# Patient Record
Sex: Male | Born: 1955 | Race: Black or African American | Hispanic: No | Marital: Single | Smoking: Former smoker
Health system: Southern US, Community
[De-identification: ages and names within clinical notes are randomized; demographics above are authoritative.]

## PROBLEM LIST (undated history)

## (undated) DIAGNOSIS — I219 Acute myocardial infarction, unspecified: Secondary | ICD-10-CM

## (undated) DIAGNOSIS — I10 Essential (primary) hypertension: Secondary | ICD-10-CM

## (undated) DIAGNOSIS — I2510 Atherosclerotic heart disease of native coronary artery without angina pectoris: Secondary | ICD-10-CM

## (undated) DIAGNOSIS — I639 Cerebral infarction, unspecified: Secondary | ICD-10-CM

## (undated) DIAGNOSIS — E785 Hyperlipidemia, unspecified: Secondary | ICD-10-CM

## (undated) HISTORY — PX: COLONOSCOPY: SHX174

## (undated) HISTORY — DX: Atherosclerotic heart disease of native coronary artery without angina pectoris: I25.10

## (undated) HISTORY — DX: Hyperlipidemia, unspecified: E78.5

---

## 2008-04-21 DIAGNOSIS — I219 Acute myocardial infarction, unspecified: Secondary | ICD-10-CM

## 2008-04-21 HISTORY — DX: Acute myocardial infarction, unspecified: I21.9

## 2008-04-23 ENCOUNTER — Inpatient Hospital Stay (HOSPITAL_COMMUNITY): Admission: EM | Admit: 2008-04-23 | Discharge: 2008-04-27 | Payer: Self-pay | Admitting: Cardiology

## 2008-04-23 ENCOUNTER — Ambulatory Visit: Payer: Self-pay | Admitting: Internal Medicine

## 2008-04-24 ENCOUNTER — Encounter: Payer: Self-pay | Admitting: Cardiovascular Disease

## 2008-04-24 ENCOUNTER — Encounter: Payer: Self-pay | Admitting: Cardiology

## 2008-05-11 ENCOUNTER — Ambulatory Visit: Payer: Self-pay | Admitting: Cardiology

## 2009-01-31 DIAGNOSIS — I2510 Atherosclerotic heart disease of native coronary artery without angina pectoris: Secondary | ICD-10-CM

## 2009-01-31 DIAGNOSIS — E785 Hyperlipidemia, unspecified: Secondary | ICD-10-CM | POA: Insufficient documentation

## 2009-04-21 DIAGNOSIS — I639 Cerebral infarction, unspecified: Secondary | ICD-10-CM

## 2009-04-21 HISTORY — DX: Cerebral infarction, unspecified: I63.9

## 2009-11-03 ENCOUNTER — Observation Stay (HOSPITAL_COMMUNITY): Admission: EM | Admit: 2009-11-03 | Discharge: 2009-11-06 | Payer: Self-pay | Admitting: Emergency Medicine

## 2009-11-05 ENCOUNTER — Encounter (INDEPENDENT_AMBULATORY_CARE_PROVIDER_SITE_OTHER): Payer: Self-pay | Admitting: Neurology

## 2010-04-21 HISTORY — PX: COLONOSCOPY: SHX174

## 2010-07-06 LAB — GLUCOSE, CAPILLARY
Glucose-Capillary: 101 mg/dL — ABNORMAL HIGH (ref 70–99)
Glucose-Capillary: 107 mg/dL — ABNORMAL HIGH (ref 70–99)
Glucose-Capillary: 122 mg/dL — ABNORMAL HIGH (ref 70–99)
Glucose-Capillary: 85 mg/dL (ref 70–99)
Glucose-Capillary: 87 mg/dL (ref 70–99)
Glucose-Capillary: 91 mg/dL (ref 70–99)
Glucose-Capillary: 99 mg/dL (ref 70–99)

## 2010-07-06 LAB — CBC
HCT: 46.1 % (ref 39.0–52.0)
Hemoglobin: 15.9 g/dL (ref 13.0–17.0)
MCH: 32.6 pg (ref 26.0–34.0)
MCHC: 34.4 g/dL (ref 30.0–36.0)
MCV: 94.7 fL (ref 78.0–100.0)
Platelets: 186 10*3/uL (ref 150–400)
WBC: 4.1 10*3/uL (ref 4.0–10.5)

## 2010-07-06 LAB — LIPID PANEL
Cholesterol: 123 mg/dL (ref 0–200)
Total CHOL/HDL Ratio: 3.2 RATIO

## 2010-07-06 LAB — APTT: aPTT: 25 seconds (ref 24–37)

## 2010-07-06 LAB — DIFFERENTIAL
Eosinophils Absolute: 0.1 10*3/uL (ref 0.0–0.7)
Eosinophils Relative: 3 % (ref 0–5)
Monocytes Absolute: 0.4 10*3/uL (ref 0.1–1.0)
Neutro Abs: 1.8 10*3/uL (ref 1.7–7.7)

## 2010-07-06 LAB — URINALYSIS, ROUTINE W REFLEX MICROSCOPIC
Bilirubin Urine: NEGATIVE
Specific Gravity, Urine: 1.02 (ref 1.005–1.030)
pH: 5.5 (ref 5.0–8.0)

## 2010-07-06 LAB — PROTIME-INR: Prothrombin Time: 12.9 seconds (ref 11.6–15.2)

## 2010-07-06 LAB — DRUGS OF ABUSE SCREEN W/O ALC, ROUTINE URINE
Barbiturate Quant, Ur: NEGATIVE
Cocaine Metabolites: NEGATIVE
Creatinine,U: 181.9 mg/dL
Marijuana Metabolite: POSITIVE — AB
Methadone: NEGATIVE
Opiate Screen, Urine: NEGATIVE
Phencyclidine (PCP): NEGATIVE
Propoxyphene: NEGATIVE

## 2010-07-06 LAB — COMPREHENSIVE METABOLIC PANEL
AST: 28 U/L (ref 0–37)
Alkaline Phosphatase: 73 U/L (ref 39–117)
CO2: 28 mEq/L (ref 19–32)
Chloride: 102 mEq/L (ref 96–112)
GFR calc Af Amer: >60 mL/min (ref >60)
Glucose, Bld: 86 mg/dL (ref 70–99)
Potassium: 3.7 mEq/L (ref 3.5–5.1)
Sodium: 138 mEq/L (ref 135–145)

## 2010-07-06 LAB — HEMOGLOBIN A1C: Hgb A1c MFr Bld: 5.8 % — ABNORMAL HIGH (ref <5.7)

## 2010-07-06 LAB — THC (MARIJUANA), URINE, CONFIRMATION: Marijuana, Ur-Confirmation: 337 NG/ML — ABNORMAL HIGH

## 2010-07-06 LAB — CK TOTAL AND CKMB (NOT AT ARMC)
CK, MB: 4.1 ng/mL — ABNORMAL HIGH (ref 0.3–4.0)
Total CK: 138 U/L (ref 7–232)

## 2010-08-05 LAB — BASIC METABOLIC PANEL
BUN: 11 mg/dL (ref 6–23)
BUN: 17 mg/dL (ref 6–23)
Calcium: 9.1 mg/dL (ref 8.4–10.5)
GFR calc non Af Amer: >60 mL/min (ref >60)
GFR calc non Af Amer: >60 mL/min (ref >60)
Glucose, Bld: 106 mg/dL — ABNORMAL HIGH (ref 70–99)
Glucose, Bld: 91 mg/dL (ref 70–99)
Potassium: 4 mEq/L (ref 3.5–5.1)
Sodium: 135 mEq/L (ref 135–145)

## 2010-08-05 LAB — CBC
HCT: 44.7 % (ref 39.0–52.0)
HCT: 45.4 % (ref 39.0–52.0)
HCT: 46.6 % (ref 39.0–52.0)
HCT: 48.2 % (ref 39.0–52.0)
Hemoglobin: 15.3 g/dL (ref 13.0–17.0)
Hemoglobin: 15.4 g/dL (ref 13.0–17.0)
Hemoglobin: 16.4 g/dL (ref 13.0–17.0)
MCHC: 33.5 g/dL (ref 30.0–36.0)
MCHC: 33.9 g/dL (ref 30.0–36.0)
MCHC: 34.3 g/dL (ref 30.0–36.0)
MCV: 92.9 fL (ref 78.0–100.0)
MCV: 93 fL (ref 78.0–100.0)
MCV: 93.6 fL (ref 78.0–100.0)
Platelets: 190 10*3/uL (ref 150–400)
RBC: 5.49 MIL/uL (ref 4.22–5.81)
RDW: 15 % (ref 11.5–15.5)
RDW: 15.4 % (ref 11.5–15.5)
RDW: 15.7 % — ABNORMAL HIGH (ref 11.5–15.5)
RDW: 15.8 % — ABNORMAL HIGH (ref 11.5–15.5)
WBC: 7.3 10*3/uL (ref 4.0–10.5)
WBC: 8.6 10*3/uL (ref 4.0–10.5)

## 2010-08-05 LAB — URINE MICROSCOPIC-ADD ON

## 2010-08-05 LAB — PROTEIN ELECTROPH W RFLX QUANT IMMUNOGLOBULINS
Alpha-2-Globulin: 14.2 % — ABNORMAL HIGH (ref 7.1–11.8)
Beta Globulin: 4.4 % — ABNORMAL LOW (ref 4.7–7.2)
Gamma Globulin: 23.3 % — ABNORMAL HIGH (ref 11.1–18.8)
M-Spike, %: NOT DETECTED g/dL

## 2010-08-05 LAB — LIPID PANEL
HDL: 47 mg/dL (ref >39)
Total CHOL/HDL Ratio: 3.2 RATIO
VLDL: 10 mg/dL (ref 0–40)

## 2010-08-05 LAB — URINALYSIS, ROUTINE W REFLEX MICROSCOPIC
Hgb urine dipstick: NEGATIVE
Leukocytes, UA: NEGATIVE
Nitrite: NEGATIVE
Protein, ur: 30 mg/dL — AB
Urobilinogen, UA: 0.2 mg/dL (ref 0.0–1.0)

## 2010-08-05 LAB — CARDIAC PANEL(CRET KIN+CKTOT+MB+TROPI): Relative Index: 1.8 (ref 0.0–2.5)

## 2010-09-03 NOTE — Cardiovascular Report (Signed)
NAME:  Ethan Fields, Ethan Fields NO.:  [id number]   MEDICAL RECORD NO.:  [id number]          PATIENT TYPE:  INP   LOCATION:  2910                         FACILITY:  [facility]   PHYSICIAN:  Veverly Fells. Excell Seltzer, MD  DATE OF BIRTH:  12-Apr-1956   DATE OF PROCEDURE:  04/23/2008  DATE OF DISCHARGE:                            CARDIAC CATHETERIZATION   PROCEDURES:  Left heart catheterization, selective coronary angiography,  and left ventricular angiography.   INDICATIONS:  Ethan Fields is a 55 year old gentleman who presented to  [facility] with 2 days of chest pain.  His EKG was suggestive of  an evolving inferior wall MI with ST elevation and T-wave changes.  He  describes persistent chest pain for 48 hours.  At its worse, the pain  has been 7/10; at present he rates it at 5/10.  He has no past cardiac  history.  We elected to proceed with emergent cardiac catheterization in  the setting of his inferior injury current and ongoing chest pain.   Risks and indications of the procedure were reviewed with the patient.  Informed consent was obtained.  The right groin was prepped, draped, and  anesthetized with 1% lidocaine.  Using a modified Seldinger technique, a  6-French sheath was placed in the right femoral artery.  Standard 6-  French Judkins catheters were used for coronary angiography and left  ventriculography.  All catheter exchanges were performed over a  guidewire.  The patient tolerated procedure well.  There were no  immediate complications.   FINDINGS:  Aortic pressure 171/94 with a mean of 124, left ventricular  pressure 170/2.   Left mainstem:  The left mainstem shows very minor luminal  irregularities, it is widely patent and trifurcates into the LAD,  intermediate branch, and left circumflex.   LAD:  The LAD is a very large caliber vessel.  It courses down and wraps  around the LV apex.  Proximally, there is an irregular 30% focal  stenosis before the first  septal perforator.  The remaining portions of  the vessel have no significant angiographic stenosis.  There were 2  small diagonal branches from the mid and distal LAD, but no major  diagonals are present.   Ramus intermedius:  The intermediate is a large-caliber vessel.  It  branches into 3 vessels.  In the midportion. there is no significant  stenosis present.   Left circumflex.  The left circumflex is a moderate-to-large vessel that  supplies a small OM1 and a large OM2 branch.  The mid left circumflex  after the first OM has a 40-50% stenosis at an area of angulation in the  vessel.  The midcircumflex further down in the vessel has a 30% stenosis  just before the second OM.  The large second OM is widely patent with no  significant stenosis.  There are no high-grade stenoses in the left  circumflex.   Right coronary artery:  The right coronary artery is dominant.  The  vessel supplies an acute marginal branch as well as a PDA and a small  posterolateral branch, both the PDA and posterolateral branches  end  abruptly and raised a question of whether they have distal occlusions.  Both vessels are very small distally at less than 1 mm.  The major  portions of the epicardial right coronary artery are widely patent.  There is mild luminal irregularities throughout the proximal and mid  vessel.  The mid vessel has a 30% stenosis.  There are no areas of  hypodensity and also no areas that are suggestive of plaque rupture.  There is a question of very distal PDA collateralization from the distal  left coronary artery system, but it is difficult to identify this for  certain.   Left ventriculography shows severe hypokinesis of the basal and mid  inferior walls.  The other remaining portions of the LV contract  normally.  The LVEF is estimated at 45%.   ASSESSMENT:  1. Inferior myocardial infarction, probably secondary to distal right      posterior descending artery occlusion.  2.  Nonobstructive left main, left anterior descending, and left      circumflex stenosis.  3. Moderate left ventricular dysfunction consistent with inferior      myocardial infarction.   PLAN:  Ethan Fields should be treated medically.  The right coronary  artery and its major branches have no significant stenosis with the  exception of a possible distal PDA occlusion in a very small portion of  this vessel.  We will treat with aspirin and Plavix.  We will resume  heparin drip 6 hours after the sheath is out.  The patient is  hypertensive and we will treat his blood pressure initially with beta-  blockers.      Veverly Fells. Excell Seltzer, MD  Electronically Signed     MDC/MEDQ  D:  04/23/2008  T:  04/24/2008  Job:  [id number]

## 2010-09-03 NOTE — H&P (Signed)
NAME:  Ethan, Fields NO.:  [id number]   MEDICAL RECORD NO.:  [id number]          PATIENT TYPE:  INP   LOCATION:  2910                         FACILITY:  [facility]   PHYSICIAN:  Therisa Doyne, MD    DATE OF BIRTH:  1955/11/16   DATE OF ADMISSION:  04/23/2008  DATE OF DISCHARGE:                              HISTORY & PHYSICAL   PRIMARY CARDIOLOGIST:  Dr. Excell Seltzer of [place].   PRIMARY CARE PHYSICIAN:  Dr. Erasmo Downer.   CHIEF COMPLAINT:  Chest pain status post ST elevation myocardial  infarction.   HISTORY OF PRESENT ILLNESS:  A 55 year old [place] American male with no  prior cardiovascular history and no prior past medical history who  presented to [place], [place] at [facility] with complaints  of chest pain.  Patient reports a 2-day history of sharp, substernal  chest pressure which radiates to his left arm.  Pain was described as  indigestion and at its worse was a 7/10 on the pain scale.  He also  complains of associated headache and emesis which occurred 5 times  yesterday.  He denies any lower-extremity edema, PND, orthopnea and  dyspnea on exertion.   While at the outside hospital, the patient was noted to have inferior ST  segment elevation and code STEMI was called.  The patient was  transferred to [facility] where he was taken emergently to the  cardiac catheterization lab.  In the cardiac catheterization lab it  showed a 100% distal right PDA occlusion with faint left-to-right  collaterals; there was evidence of inferior hypokinesis with a left  ventricular ejection fraction of 40 to 45%.  There was nonobstructive  disease in the left main, left anterior descending artery and left  circumflex artery.  It was recommended that medical therapy be pursued  as the distal PDA was a very small vessel; in addition, it was unclear  whether or not there was a complete occlusion of the PDA.   PAST MEDICAL HISTORY:  No known past history.   The patient states he has  not been to a doctor for the past few years.   SOCIAL HISTORY:  [place]  long-term girlfriend.  He has a 25 pack-year history of smoking stating  he smoked a pack per day for 25 years.  He drinks alcohol on occasion,  stating that he drinks less than 1 drink per month.  He does use  marijuana but denies using cocaine.   FAMILY HISTORY:  There is no history of early coronary disease.   REVIEW OF SYSTEMS:  All systems reviewed and negative except as  mentioned above in history of present illness.   PRESENT MEDICATIONS:  None.   ALLERGIES:  No known drug allergies.   PHYSICAL EXAMINATION:  VITAL SIGNS:  Temperature 98, blood pressure  145/102 on  nitroglycerin drip at 20 mg, pulse 76, respirations 18,  oxygen saturation 98% on 2L.  GENERAL:  No acute distress.  HEENT:  Normocephalic, atraumatic.  PERRLA.  Extraocular movements are  intact.  Oropharynx is pink and  moist without any lesions.  No lymphadenopathy.  No jugular venous distention.  LUNGS:  Crackles bilaterally.  CARDIOVASCULAR:  Regular rate and rhythm.  No murmurs or gallops.  ABDOMEN:  Positive bowel sounds, soft, nontender and nondistended.  EXTREMITIES:  No clubbing, cyanosis, or edema.  The cath site in the  right groin appears to be clean, dry and intact with no evidence of  hematoma.  Dorsalis pedis pulses are 2+ bilaterally.  SKIN:  No rashes.  BACK:  No CVA tenderness.   EKG from the outside hospital shows sinus rhythm at 72 beats per minute,  ST segment elevation, 2 mm inferior leads, T-wave in V3 and V4.   Labs: 1.  Hemoglobin 19.9, platelets 225, BUN 8, creatinine 1, total  protein 9.3, albumin 4.2, troponin 8.9, CK 938, CK-MB 112.   ASSESSMENT AND PLAN:  A 55 year old African American male with no prior  cardiovascular disease, no prior medical history who presents with 2  days of chest discomfort who was found to have evidence of an  inferior  myocardial infarction secondary to a probable distal right posterior  descending artery occlusion. American male with no prior  cardiovascular disease, no prior medical history who presents with 2  days of chest discomfort who was found to have evidence of an  inferior  myocardial infarction secondary to a probable distal right posterior  descending artery occlusion.   1. We will admit him to the [facility] Cardiology service to Dr. Earmon Phoenix      care and monitor in the CTU.   1. Inferior myocardial infarction.  At this time, the patient's chest      pain has improved and his cardiac catheterization shows the RCA to      be patent.  The major branches have no significant stenosis except the distal PDA  occlusion.  Because of this medical treatment will be the best course of  management.  Therefore, we will treat the patient with aspirin 325 mg  daily, start him on Plavix 75mg  daily, start Coreg 6.25 mg b.i.d., start  lipitor 80mg  daily.   1. Congestive heart failure with left ventricular dysfunction with      inferior hypokinesis.  We are going to start the patient on      appropriate heart failure therapy with Coreg and ACE inhibitor.   1. Elevated hemoglobin at outside hospital with  evidence of high      protein and a normal albumin and high calcium: we will repeat a CBC      and check SPEP and UPEP.   1. Fluids, electrolytes and nutrition.  Normal saline at 75cc/hour.      Therisa Doyne, MD  Electronically Signed     SJT/MEDQ  D:  04/23/2008  T:  04/23/2008  Job:  [id number]

## 2010-09-03 NOTE — Discharge Summary (Signed)
NAME:  Ethan Fields, Ethan Fields NO.:  [id number]   MEDICAL RECORD NO.:  [id number]          PATIENT TYPE:  INP   LOCATION:  6529                         FACILITY:  [facility]   PHYSICIAN:  Ethan Fells. Excell Seltzer, MD  DATE OF BIRTH:  Oct 11, 1955   DATE OF ADMISSION:  04/23/2008  DATE OF DISCHARGE:  04/27/2008                               DISCHARGE SUMMARY   PRIMARY CARDIOLOGIST:  Ethan Sidle, MD   INTERVENTIONAL CARDIOLOGIST:  Ethan Fells. Excell Seltzer, MD   PRIMARY CARE PHYSICIAN:  Ethan Downer, MD.   PROCEDURES PERFORMED DURING HOSPITALIZATION:  1. Emergent cardiac catheterization dated April 23, 2008 per Dr.      Tonny Fields revealing inferior myocardial infarction, probably      secondary to distal right posterior descending artery occlusion,      nonobstructive left main, left anterior descending, and left      circumflex stenosis, moderate left ventricular dysfunction      consistent with inferior myocardial infarction.  2. Echocardiogram dated April 24, 2008 despite measurements that      septum looks thick.  There is mild left ventricular cavity      obliteration in systole.  There is no systolic anterior motion of      the mitral valve.  There may be some decreased in thickening at the      base of the inferior wall.  The left ventricle was small.  Overall,      ventricular systolic function was vigorous, left ventricular      ejection fraction estimated at 70%.  Left ventricular wall      thickness was mildly increased, right ventricular size was at the      upper limits of normal, right ventricular systolic function was      mildly reduced.  There was mild right ventricular hypertrophy.  The      right atrium was mildly dilated.   FINAL DISCHARGE DIAGNOSES:  1. Inferior myocardial infarction, late presentation.  2. Post myocardial infarction pericarditis.  3. Hypertension.  4. Dyslipidemia.   HOSPITAL COURSE:  This is a 52-year African American male with no  prior  cardiac history and no prior medical history at all who presented to  [facility] with complaints of chest pain after a 2-day history of  sharp substernal pressure would radiating to his left arm.  He described  it as indigestion and that it worse, 7/10 on the pain scale.  While at  [facility], the patient was noted to have an inferior ST-segment  elevation and a code STEMI was called and the patient was transferred  emergently to [facility] for cardiac catheterization.  The  patient did undergo emergent cardiac catheterization per Dr. Tonny Fields revealing a 100% distal right PDA occlusion with faint left-to-  right collaterals.  There was also evidence of inferior hypokinesis with  left ventricular ejection fraction of 40-45%.  There was nonobstructive  disease elsewhere.   The patient was transferred to ICU and will be treated medically as the  right coronary artery and its major branches had no significant stenosis  with the exception of a possible distal PDA occlusion at a very small  portion of this vessel.  He was started on aspirin and Plavix, beta-  blocker, and nitroglycerin was weaned off.   The patient continued to have subscapular pleuritic pain postprocedure.  EKG showed diffuse ST elevation with symptoms suspicious for post MI  pericarditis.  At that point, his heparin was discontinued and the  patient was started on colchicine.  Echo was performed showing no  pericardial effusion.   The patient was continued on NSAIDs to include colchicine and higher  doses of aspirin at 325 b.i.d.  The patient was also started on Coreg  6.25 mg b.i.d., lisinopril 10 mg daily, Crestor daily.  On April 25, 2008, the patient was transferred to telemetry and worked with cardiac  rehab.   The patient also was given smoking cessation instructions and advised to  follow up with cardiac rehab and encouraged to continue smoking  cessation as he had during  hospitalization.   On day of discharge, the patient continued to have persistent chest  discomfort that was improving.  The pain remained pleuritic, but there  was no dyspnea or other complaints.  The patient's vital signs were  stable and the patient was felt healthy enough to return home.  The  patient will follow up in [place] and will be established with Dr. Nona Fields in our practice.  The patient will be seen in 2 weeks and will  follow with cardiac rehab in [place].   LABORATORIES:  Hemoglobin 15.3, hematocrit 44.7, white blood cells 6.9,  platelets 220.  Troponin initially 13.60, followup troponin 4.19 on  April 25, 2008, sodium 131, potassium 4.0, chloride 97, CO2 26, glucose  91, BUN 17, creatinine 1.02.  Chest x-ray dated April 24, 2008, no  active lung disease.   VITAL SIGNS ON DISCHARGE:  Blood pressure 105/63, pulse 89, respirations  18, temperature 97.9, O2 sat 96% on room air.  The patient weights 73.5  kg on discharge.   DISCHARGE MEDICATIONS:  1. Aspirin 325 mg twice a day for 2 weeks, then 325 mg daily.  2. Colchicine 0.1 mg twice a day for 2 weeks.  3. Coreg 6.25 mg twice a day.  4. Plavix 75 mg daily.  5. Lipitor 40 mg daily.  6. Lisinopril 10 mg daily.  7. Nitroglycerin 0.4 mg sublingual p.r.n. recurrent chest pain      (prescription provided for all medications as listed).   ALLERGIES:  No known drug allergies.   FOLLOWUP PLANS AND APPOINTMENT:  1. The patient will follow up with Dr. Nona Fields on Thursday,      May 11, 2008 at 10:30 a.m.  2. The patient is to follow up with primary care physician, Dr. Linna Fields      in [place].  3. The patient has been given smoking cessation instructions and      encouraged to quit.  4. The patient has been given post cardiac catheterization      instructions with particular episodes on the right groin for      evidence of bleeding hematoma or infection.  5. The patient has been advised to follow up with  cardiac rehab.  6. The patient has been advised on new medications prescribed.   TIME SPENT WITH THE PATIENT TO INCLUDE PHYSICIAN TIME:  35 minutes.      Ethan Mare. Lyman Bishop, NP      Ethan Fells. Excell Seltzer, MD  Electronically Signed  KML/MEDQ  D:  04/27/2008  T:  04/27/2008  Job:  [id number]   cc:   Ethan Downer, MD

## 2010-09-03 NOTE — Assessment & Plan Note (Signed)
[facility]                          [facility] OFFICE NOTE   NAME:Ethan Fields, Ethan Fields                       MRN:          [id number]  DATE:05/11/2008                            DOB:          12/07/1955    PRIMARY CARE PHYSICIAN:  Erasmo Downer, MD   REASON FOR VISIT:  Post hospital followup.   HISTORY OF PRESENT ILLNESS:  Mr. Cockerell was transferred urgently from  [facility] to [facility] back on April 23, 2008, with chest pain and concerns for an acute coronary syndrome  involving the inferior wall.  He underwent an urgent cardiac  catheterization by Dr. Excell Seltzer which revealed apparent occlusion and a  small posterior descending branch of the right coronary artery with  otherwise nonobstructive disease of the left system, no greater than 40-  50% predominantly involving the obtuse marginals, less significant left  anterior descending.  His ejection fraction was 45% with basal and mid  inferior hypokinesis.  He was treated medically with dual antiplatelet  regiment and also placed on Lipitor, carvedilol, and lisinopril.  He  comes to the office today reporting that his groin site has healed well  postcatheterization.  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  He states that this does not require any  heavy lifting, certainly nothing over 50 pounds.  He is not reporting  any anginal chest pain.  I reviewed his medications today.   ALLERGIES:  No known drug allergies.   MEDICATIONS:  1. Aspirin 325 mg p.o. daily.  2. Plavix 75 mg p.o. q.a.m.  3. Lipitor 40 mg p.o. nightly.  4. Carvedilol 6.25 mg p.o. b.i.d.  5. Lisinopril 10 mg p.o. q.a.m.  6. Sublingual nitroglycerin 0.4 mg p.r.n.   REVIEW OF SYSTEMS:  As described in the history of present illness,  otherwise negative.   PHYSICAL EXAMINATION:  VITAL SIGNS:  Blood pressure today is 160/90,  weight 158 pounds, heart rate 64 and  regular.  GENERAL:  This is a normally nourished-appearing male in no acute  distress.  HEENT:  Conjunctiva is normal.  Oropharynx is clear.  NECK:  Supple.  No elevated jugular venous pressure.  No loud bruits.  No thyromegaly.  LUNGS:  Clear without labored breathing.  CARDIAC:  Regular rate and rhythm without rub, murmur, or gallop.  No  pericardial rub.  ABDOMEN:  Soft, nontender.  Normoactive bowel sounds.  EXTREMITIES:  No significant pitting edema.  Distal pulses are 2+.  SKIN:  Warm and dry.  MUSCULOSKELETAL:  No kyphosis noted.  NEUROPSYCHIATRIC:  Alert and oriented x3.   IMPRESSION AND RECOMMENDATIONS:  Coronary artery disease status post  inferior wall myocardial infarction related to occlusion of the distal  posterior descending branch of the right coronary artery with otherwise  nonobstructive atherosclerosis within the left system and an ejection  fraction of 45%.  Plan at this point is for medical therapy which looks  reasonable, although may need further titration depending on blood  pressure and symptom control.  He will need followup lipid profile over  the next 3 months with goal LDL around 70.  He should be able to return  to work with prior stated responsibilities over the next week placing  him approximately 4 weeks out from his event.  I will plan to see him  back over the next 3 months.     Jonelle Sidle, MD  Electronically Signed    SGM/MedQ  DD: 05/11/2008  DT: 05/11/2008  Job #: [id number]   cc:   Erasmo Downer, MD

## 2011-11-13 ENCOUNTER — Encounter: Payer: Self-pay | Admitting: *Deleted

## 2016-10-28 ENCOUNTER — Observation Stay (HOSPITAL_COMMUNITY)
Admission: EM | Admit: 2016-10-28 | Discharge: 2016-10-29 | Disposition: A | Discharge status: Home / Self Care | Payer: BLUE CROSS/BLUE SHIELD | Attending: Internal Medicine | Admitting: Internal Medicine

## 2016-10-28 ENCOUNTER — Emergency Department (HOSPITAL_COMMUNITY): Payer: BLUE CROSS/BLUE SHIELD

## 2016-10-28 ENCOUNTER — Encounter (HOSPITAL_COMMUNITY): Payer: Self-pay | Admitting: Emergency Medicine

## 2016-10-28 DIAGNOSIS — I252 Old myocardial infarction: Secondary | ICD-10-CM | POA: Diagnosis not present

## 2016-10-28 DIAGNOSIS — Z87891 Personal history of nicotine dependence: Secondary | ICD-10-CM | POA: Insufficient documentation

## 2016-10-28 DIAGNOSIS — Z8673 Personal history of transient ischemic attack (TIA), and cerebral infarction without residual deficits: Secondary | ICD-10-CM | POA: Insufficient documentation

## 2016-10-28 DIAGNOSIS — R29701 NIHSS score 1: Secondary | ICD-10-CM | POA: Diagnosis not present

## 2016-10-28 DIAGNOSIS — I10 Essential (primary) hypertension: Secondary | ICD-10-CM | POA: Diagnosis not present

## 2016-10-28 DIAGNOSIS — I2510 Atherosclerotic heart disease of native coronary artery without angina pectoris: Secondary | ICD-10-CM | POA: Diagnosis present

## 2016-10-28 DIAGNOSIS — I639 Cerebral infarction, unspecified: Secondary | ICD-10-CM | POA: Diagnosis not present

## 2016-10-28 DIAGNOSIS — E785 Hyperlipidemia, unspecified: Secondary | ICD-10-CM | POA: Diagnosis present

## 2016-10-28 DIAGNOSIS — Z79899 Other long term (current) drug therapy: Secondary | ICD-10-CM | POA: Diagnosis not present

## 2016-10-28 DIAGNOSIS — E782 Mixed hyperlipidemia: Secondary | ICD-10-CM | POA: Diagnosis not present

## 2016-10-28 DIAGNOSIS — Z7902 Long term (current) use of antithrombotics/antiplatelets: Secondary | ICD-10-CM | POA: Insufficient documentation

## 2016-10-28 DIAGNOSIS — R531 Weakness: Secondary | ICD-10-CM | POA: Diagnosis present

## 2016-10-28 HISTORY — DX: Essential (primary) hypertension: I10

## 2016-10-28 HISTORY — DX: Acute myocardial infarction, unspecified: I21.9

## 2016-10-28 HISTORY — DX: Cerebral infarction, unspecified: I63.9

## 2016-10-28 LAB — CBC WITH DIFFERENTIAL/PLATELET
BASOS PCT: 0 %
Basophils Absolute: 0 10*3/uL (ref 0.0–0.1)
Eosinophils Absolute: 0 10*3/uL (ref 0.0–0.7)
Eosinophils Relative: 0 %
HEMATOCRIT: 43.8 % (ref 39.0–52.0)
Hemoglobin: 15.6 g/dL (ref 13.0–17.0)
Lymphocytes Relative: 27 %
Lymphs Abs: 2.1 10*3/uL (ref 0.7–4.0)
MCH: 32 pg (ref 26.0–34.0)
MCHC: 35.6 g/dL (ref 30.0–36.0)
MCV: 89.8 fL (ref 78.0–100.0)
MONO ABS: 0.7 10*3/uL (ref 0.1–1.0)
MONOS PCT: 9 %
NEUTROS ABS: 5 10*3/uL (ref 1.7–7.7)
Neutrophils Relative %: 64 %
Platelets: 225 10*3/uL (ref 150–400)
RBC: 4.88 MIL/uL (ref 4.22–5.81)
RDW: 16.6 % — AB (ref 11.5–15.5)
WBC: 7.7 10*3/uL (ref 4.0–10.5)

## 2016-10-28 LAB — COMPREHENSIVE METABOLIC PANEL
ALT: 20 U/L (ref 17–63)
AST: 22 U/L (ref 15–41)
Albumin: 3.9 g/dL (ref 3.5–5.0)
Alkaline Phosphatase: 78 U/L (ref 38–126)
Anion gap: 12 (ref 5–15)
BILIRUBIN TOTAL: 0.7 mg/dL (ref 0.3–1.2)
BUN: 12 mg/dL (ref 6–20)
CHLORIDE: 102 mmol/L (ref 101–111)
CO2: 25 mmol/L (ref 22–32)
Calcium: 9.2 mg/dL (ref 8.9–10.3)
Creatinine, Ser: 0.86 mg/dL (ref 0.61–1.24)
Glucose, Bld: 95 mg/dL (ref 65–99)
POTASSIUM: 4.1 mmol/L (ref 3.5–5.1)
Sodium: 139 mmol/L (ref 135–145)
TOTAL PROTEIN: 7.8 g/dL (ref 6.5–8.1)

## 2016-10-28 LAB — I-STAT CHEM 8, ED
BUN: 16 mg/dL (ref 6–20)
CALCIUM ION: 1.06 mmol/L — AB (ref 1.15–1.40)
Chloride: 106 mmol/L (ref 101–111)
Creatinine, Ser: 0.9 mg/dL (ref 0.61–1.24)
GLUCOSE: 97 mg/dL (ref 65–99)
HCT: 48 % (ref 39.0–52.0)
HEMOGLOBIN: 16.3 g/dL (ref 13.0–17.0)
Potassium: 4.3 mmol/L (ref 3.5–5.1)
Sodium: 140 mmol/L (ref 135–145)
TCO2: 24 mmol/L (ref 0–100)

## 2016-10-28 LAB — RAPID URINE DRUG SCREEN, HOSP PERFORMED
AMPHETAMINES: NOT DETECTED
BARBITURATES: NOT DETECTED
BENZODIAZEPINES: NOT DETECTED
Cocaine: NOT DETECTED
Opiates: NOT DETECTED
TETRAHYDROCANNABINOL: POSITIVE — AB

## 2016-10-28 LAB — ETHANOL: Alcohol, Ethyl (B): <5 mg/dL (ref <5)

## 2016-10-28 LAB — URINALYSIS, ROUTINE W REFLEX MICROSCOPIC
BILIRUBIN URINE: NEGATIVE
Glucose, UA: NEGATIVE mg/dL
HGB URINE DIPSTICK: NEGATIVE
KETONES UR: NEGATIVE mg/dL
Leukocytes, UA: NEGATIVE
Nitrite: NEGATIVE
PROTEIN: NEGATIVE mg/dL
Specific Gravity, Urine: 1.019 (ref 1.005–1.030)
pH: 5 (ref 5.0–8.0)

## 2016-10-28 LAB — I-STAT TROPONIN, ED: TROPONIN I, POC: 0.02 ng/mL (ref 0.00–0.08)

## 2016-10-28 LAB — PROTIME-INR
INR: 0.97
PROTHROMBIN TIME: 12.9 s (ref 11.4–15.2)

## 2016-10-28 LAB — APTT: aPTT: 27 seconds (ref 24–36)

## 2016-10-28 LAB — TROPONIN I

## 2016-10-28 MED ORDER — SODIUM CHLORIDE 0.9 % IV SOLN
100.0000 mL/h | INTRAVENOUS | Status: DC
  Administered 2016-10-28: 100 mL/h via INTRAVENOUS

## 2016-10-28 MED ORDER — SODIUM CHLORIDE 0.9 % IV BOLUS (SEPSIS)
500.0000 mL | Freq: Once | INTRAVENOUS | Status: AC
  Administered 2016-10-28: 500 mL via INTRAVENOUS

## 2016-10-28 MED ORDER — ASPIRIN 325 MG PO TABS
325.0000 mg | ORAL_TABLET | Freq: Every day | ORAL | Status: DC
  Administered 2016-10-28 – 2016-10-29 (×2): 325 mg via ORAL
  Filled 2016-10-28 (×2): qty 1

## 2016-10-28 MED ORDER — CLOPIDOGREL BISULFATE 75 MG PO TABS
75.0000 mg | ORAL_TABLET | Freq: Every day | ORAL | Status: DC
  Administered 2016-10-28 – 2016-10-29 (×2): 75 mg via ORAL
  Filled 2016-10-28 (×2): qty 1

## 2016-10-28 MED ORDER — ACETAMINOPHEN 325 MG PO TABS
650.0000 mg | ORAL_TABLET | ORAL | Status: DC | PRN
  Administered 2016-10-28: 650 mg via ORAL
  Filled 2016-10-28: qty 2

## 2016-10-28 MED ORDER — STROKE: EARLY STAGES OF RECOVERY BOOK
Freq: Once | Status: DC
  Filled 2016-10-28: qty 1

## 2016-10-28 MED ORDER — CARVEDILOL 3.125 MG PO TABS
6.2500 mg | ORAL_TABLET | Freq: Two times a day (BID) | ORAL | Status: DC
  Administered 2016-10-28 – 2016-10-29 (×3): 6.25 mg via ORAL
  Filled 2016-10-28 (×3): qty 2

## 2016-10-28 MED ORDER — SENNOSIDES-DOCUSATE SODIUM 8.6-50 MG PO TABS: 1.0000 | ORAL_TABLET | Freq: Every evening | ORAL | Status: DC | PRN

## 2016-10-28 MED ORDER — ASPIRIN 300 MG RE SUPP: 300.0000 mg | Freq: Every day | RECTAL | Status: DC

## 2016-10-28 MED ORDER — HEPARIN SODIUM (PORCINE) 5000 UNIT/ML IJ SOLN
5000.0000 [IU] | Freq: Three times a day (TID) | INTRAMUSCULAR | Status: DC
  Administered 2016-10-28 – 2016-10-29 (×3): 5000 [IU] via SUBCUTANEOUS
  Filled 2016-10-28 (×3): qty 1

## 2016-10-28 MED ORDER — ATORVASTATIN CALCIUM 40 MG PO TABS
80.0000 mg | ORAL_TABLET | Freq: Every day | ORAL | Status: DC
  Administered 2016-10-28 – 2016-10-29 (×2): 80 mg via ORAL
  Filled 2016-10-28 (×3): qty 2

## 2016-10-28 MED ORDER — ACETAMINOPHEN 650 MG RE SUPP: 650.0000 mg | RECTAL | Status: DC | PRN

## 2016-10-28 MED ORDER — ACETAMINOPHEN 160 MG/5ML PO SOLN: 650.0000 mg | ORAL | Status: DC | PRN

## 2016-10-28 MED ORDER — SODIUM CHLORIDE 0.9 % IV SOLN
INTRAVENOUS | Status: DC
  Administered 2016-10-28 – 2016-10-29 (×2): via INTRAVENOUS

## 2016-10-28 NOTE — ED Notes (Signed)
Pt has returned from MRI. 

## 2016-10-28 NOTE — ED Provider Notes (Signed)
[facility] DEPT Provider Note   CSN: [id number] Arrival date & time: 10/28/16  1318   An emergency department physician performed an initial assessment on this suspected stroke patient at 1321.  History   Chief Complaint Chief Complaint  Patient presents with  . Weakness    HPI Ethan Fields is a 61 y.o. male.  HPI  Patient presents via EMS as a code stroke. Patient offers very little history of the exact onset, but it seems as though since yesterday he has had some weakness in his right upper extremity. Possibly the weakness has been more pronounced over the past 4 hours, but again this is unclear. Essentially the patient is complaining of right upper trauma weakness. He notes this has been intermittent for the past 2 weeks, he has previously been seen in emergency department for this, but has no firm diagnosis. He states that he is otherwise well, but acknowledges a history of hypertension, prior MI. Since onset symptoms have progressed, as above, without new speech difficulty, confusion, disorientation, vision changes.   Past Medical History:  Diagnosis Date  . CAD (coronary artery disease)   . Hyperlipidemia   . Hypertension   . Myocardial infarct ([place]) 2010  . Stroke [facility]) 2011   TIA    Patient Active Problem List   Diagnosis Date Noted  . HYPERLIPIDEMIA-MIXED 01/31/2009  . CAD, NATIVE VESSEL 01/31/2009    History reviewed. No pertinent surgical history.     Home Medications    Prior to Admission medications   Medication Sig Start Date End Date Taking? Authorizing Provider  carvedilol (COREG) 6.25 MG tablet Take 6.25 mg by mouth 2 (two) times daily with a meal.    [provider]  clopidogrel (PLAVIX) 75 MG tablet Take 75 mg by mouth daily.    [provider]  lisinopril (PRINIVIL,ZESTRIL) 10 MG tablet Take 10 mg by mouth daily.    [provider]    Family History History reviewed. No pertinent family  history.  Social History Social History  Substance Use Topics  . Smoking status: Former Smoker    Types: Cigarettes    Quit date: 2010  . Smokeless tobacco: Never Used  . Alcohol use Yes     Comment: occasional     Allergies   Patient has no known allergies.   Review of Systems Review of Systems  Constitutional:       Per HPI, otherwise negative  HENT:       Per HPI, otherwise negative  Respiratory:       Per HPI, otherwise negative  Cardiovascular:       Per HPI, otherwise negative  Gastrointestinal: Negative for vomiting.  Endocrine:       Negative aside from HPI  Genitourinary:       Neg aside from HPI   Musculoskeletal:       Per HPI, otherwise negative  Skin: Negative.   Neurological: Positive for weakness. Negative for syncope.     Physical Exam Updated Vital Signs BP (!) 185/107   Pulse 65   Resp 12   Ht 5\' 11"  (1.803 m)   Wt 74.8 kg (165 lb)   SpO2 100%   BMI 23.01 kg/m   Physical Exam  Constitutional: He is oriented to person, place, and time. He appears well-developed. No distress.  HENT:  Head: Normocephalic and atraumatic.  Eyes: Conjunctivae and EOM are normal.  Cardiovascular: Normal rate and regular rhythm.   Pulmonary/Chest: Effort normal. No stridor. No respiratory  distress.  Abdominal: He exhibits no distension.  Musculoskeletal: He exhibits no edema.  Neurological: He is alert and oriented to person, place, and time. He displays no tremor. He exhibits abnormal muscle tone. He displays no seizure activity.  Flaccid proximal right upper extremity, but with preserved capacity to grip, 4/5. Left upper extremity unremarkable.  Bilateral lower extremity is unremarkable, speech is clear, face is symmetric.  Skin: Skin is warm and dry.  Psychiatric: He has a normal mood and affect.  Nursing note and vitals reviewed.    ED Treatments / Results  Labs (all labs ordered are listed, but only abnormal results are displayed) Labs Reviewed   CBC WITH DIFFERENTIAL/PLATELET - Abnormal; Notable for the following:       Result Value   RDW 16.6 (*)    All other components within normal limits  I-STAT CHEM 8, ED - Abnormal; Notable for the following:    Calcium, Ion 1.06 (*)    All other components within normal limits  APTT  ETHANOL  PROTIME-INR  RAPID URINE DRUG SCREEN, HOSP PERFORMED  URINALYSIS, ROUTINE W REFLEX MICROSCOPIC  COMPREHENSIVE METABOLIC PANEL  TROPONIN I  I-STAT TROPOININ, ED    EKG  EKG Interpretation  Date/Time:  Tuesday October 28 2016 13:20:52 EDT Ventricular Rate:  67 PR Interval:    QRS Duration: 113 QT Interval:  408 QTC Calculation: 431 R Axis:   74 Text Interpretation:  Sinus rhythm Ventricular premature complex Probable left atrial enlargement Incomplete right bundle branch block Artifact T wave abnormality Abnormal ekg Confirmed by Carmin Muskrat [phone]) on 10/28/2016 1:39:59 PM       Radiology Ct Head Wo Contrast  Result Date: 10/28/2016 CLINICAL DATA:  Intermittent RIGHT-sided weakness for 2 weeks, prior data set beginning at 11 a.m. EXAM: CT HEAD WITHOUT CONTRAST TECHNIQUE: Contiguous axial images were obtained from the base of the skull through the vertex without intravenous contrast. COMPARISON:  MRI of the head November 03, 2009 FINDINGS: BRAIN: No intraparenchymal hemorrhage, mass effect nor midline shift. New small LEFT frontal LEFT parietal lobe infarcts. Old bilateral basal ganglia infarcts with mild ex vacuo dilatation suggests and ventricles, ventricles and sulci are overall normal for patient's age. The RIGHT frontal lobe encephalomalacia present on prior MRI. Patchy supratentorial white matter hypodensities progressed from prior imaging. No abnormal extra-axial fluid collections. Basal cisterns are patent. VASCULAR: Moderate calcific atherosclerosis of the carotid siphons. Slightly dense middle cerebral artery's, however this is relatively symmetric and likely from hemoconcentration.  SKULL: No skull fracture. No significant scalp soft tissue swelling. SINUSES/ORBITS: The mastoid air-cells and included paranasal sinuses are well-aerated.The included ocular globes and orbital contents are non-suspicious. OTHER: None. IMPRESSION: 1. New small LEFT frontoparietal lobe/MCA territory infarcts could be acute given patient's symptoms. 2. Old RIGHT frontal/MCA territory infarct. Old basal ganglia infarcts. 3. Moderate chronic small vessel ischemic disease, advanced from 2011. 4. Moderate atherosclerosis carotid siphon, advanced for age. Critical Value/emergent results were called by telephone at the time of interpretation on 10/28/2016 at 1:47 pm to Dr. Carmin Muskrat , who verbally acknowledged these results. Electronically Signed   By: Elon Alas M.D.   On: 10/28/2016 13:48    Procedures Procedures (including critical care time)  Medications Ordered in ED Medications  sodium chloride 0.9 % bolus 500 mL (500 mLs Intravenous New Bag/Given 10/28/16 1353)    Followed by  0.9 %  sodium chloride infusion (not administered)     Initial Impression / Assessment and Plan / ED Course  I  have reviewed the triage vital signs and the nursing notes.  Pertinent labs & imaging results that were available during my care of the patient were reviewed by me and considered in my medical decision making (see chart for details).  After the initial evaluation I discussed patient's case with the neurologist on-call. I also discussed patient's case with her radiologist given the patient's new upper extremity weakness. With concern for frontoparietal lobe infarct, patient will have MRI. Patient will require admission for further evaluation and management.   Final Clinical Impressions(s) / ED Diagnoses  Acute stroke   Carmin Muskrat, MD 10/28/16 [phone]

## 2016-10-28 NOTE — ED Triage Notes (Signed)
Pt reports intermittent right sided weakness for 2 weeks.  States current episode started after lying down for a nap at 1100am today.

## 2016-10-28 NOTE — ED Notes (Signed)
Pt and sister requesting transfer to [facility] instead of admission here at [facility]. Dr. Marin Comment notified.

## 2016-10-28 NOTE — ED Notes (Signed)
Pt reported to teleneurologist that he fell upon waking up from his nap around 1300 today. Pt reports he hit his head on a table upon fall. No LOC. Abrasion noted to right side of head. EDP notified.

## 2016-10-28 NOTE — H&P (Signed)
History and Physical    [name]:[id number] DOB: Oct 31, 1955 DOA: 10/28/2016  PCP: Monico Blitz, MD  Patient coming from: Home.    Chief Complaint:   Unable to move his right arm and having right leg weakness  HPI: Ethan Fields is an right handed 61 y.o. male with hx of CAD (no stent), prior MI, HTN, HLD, prior tobacco use quit 8 years ago, prior CVA, presented to the ER with stuttering onset of right arm and leg weakness, then total weakness yesterday.  His ictus was over 12 hours PTA.  He has no HA, facial symptoms, has normal speech and comprehension.  Work up in the ER included a head CT, followed by MRI and MRA of his brain.  These showed new small LEFT frontoparietal lobe/MCA territory infarcts could be acute given patient's symptoms along with old RIGHT frontal/MCA territory infarct. Old basal ganglia infarcts. He also has moderate chronic small vessel ischemic disease, advanced from 2011.  His MRA showed no large vessel occlusion.  He has been compliant with his Plavix, though never was on ASA.  He has no allergy to ASA.  Code stroke was called, and no TPA was recommended, as ictus was clearly out of its window.  Hospitalist was asked to admit him for further stroke work up.   ED Course:  See above.  Rewiew of Systems:  Constitutional: Negative for malaise, fever and chills. No significant weight loss or weight gain Eyes: Negative for eye pain, redness and discharge, diplopia, visual changes, or flashes of light. ENMT: Negative for ear pain, hoarseness, nasal congestion, sinus pressure and sore throat. No headaches; tinnitus, drooling, or problem swallowing. Cardiovascular: Negative for chest pain, palpitations, diaphoresis, dyspnea and peripheral edema. ; No orthopnea, PND Respiratory: Negative for cough, hemoptysis, wheezing and stridor. No pleuritic chestpain. Gastrointestinal: Negative for diarrhea, constipation,  melena, blood in stool, hematemesis, jaundice and rectal  bleeding.    Genitourinary: Negative for frequency, dysuria, incontinence,flank pain and hematuria; Musculoskeletal: Negative for back pain and neck pain. Negative for swelling and trauma.;  Skin: . Negative for pruritus, rash, abrasions, bruising and skin lesion.; ulcerations Neuro: Negative for headache, lightheadedness and neck stiffness. Negative for altered level of consciousness , altered mental status, clearly weak on the right arm 1/5 and RLE 2/5. burning feet, involuntary movement, seizure and syncope.  Psych: negative for anxiety, depression, insomnia, tearfulness, panic attacks, hallucinations, paranoia, suicidal or homicidal ideation    Past Medical History:  Diagnosis Date  . CAD (coronary artery disease)   . Hyperlipidemia   . Hypertension   . Myocardial infarct ([place]) 2010  . Stroke [facility]) 2011   TIA   History reviewed. No pertinent surgical history.   reports that he quit smoking about 8 years ago. His smoking use included Cigarettes. He has never used smokeless tobacco. He reports that he drinks alcohol. He reports that he uses drugs, including Marijuana.  No Known Allergies  History reviewed. No pertinent family history.   Prior to Admission medications   Medication Sig Start Date End Date Taking? Authorizing Provider  carvedilol (COREG) 6.25 MG tablet Take 6.25 mg by mouth 2 (two) times daily with a meal.    [provider]  clopidogrel (PLAVIX) 75 MG tablet Take 75 mg by mouth daily.    [provider]  lisinopril (PRINIVIL,ZESTRIL) 10 MG tablet Take 10 mg by mouth daily.    [provider]    Physical Exam: Vitals:   10/28/16 1415 10/28/16 1417 10/28/16  1501 10/28/16 1502  BP: (!) 168/109  (!) 176/115   Pulse: 65     Resp: 13   18  Temp:  97.9 F (36.6 C)    SpO2: 100%     Weight:      Height:          Constitutional: NAD, calm, comfortable Vitals:   10/28/16 1415 10/28/16 1417 10/28/16 1501 10/28/16 1502  BP: (!)  168/109  (!) 176/115   Pulse: 65     Resp: 13   18  Temp:  97.9 F (36.6 C)    SpO2: 100%     Weight:      Height:       Eyes: PERRL, lids and conjunctivae normal ENMT: Mucous membranes are moist. Posterior pharynx clear of any exudate or lesions.Normal dentition.  Neck: normal, supple, no masses, no thyromegaly Respiratory: clear to auscultation bilaterally, no wheezing, no crackles. Normal respiratory effort. No accessory muscle use.  Cardiovascular: Regular rate and rhythm, no murmurs / rubs / gallops. No extremity edema. 2+ pedal pulses. No carotid bruits.  Abdomen: no tenderness, no masses palpated. No hepatosplenomegaly. Bowel sounds positive.  Musculoskeletal: no clubbing / cyanosis. No joint deformity upper and lower extremities. Good ROM, no contractures. Normal muscle tone.  Skin: no rashes, lesions, ulcers. No induration Neurologic: CN 2-12 grossly intact. Sensation intact,  clearly weak on the right arm 1/5 and RLE 2/5. Psychiatric: Normal judgment and insight. Alert and oriented x 3. Normal mood.   Labs on Admission: I have personally reviewed following labs and imaging studies CBC:  Recent Labs Lab 10/28/16 1321 10/28/16 1328  WBC 7.7  --   NEUTROABS 5.0  --   HGB 15.6 16.3  HCT 43.8 48.0  MCV 89.8  --   PLT 225  --    Basic Metabolic Panel:  Recent Labs Lab 10/28/16 1328  NA 140  K 4.3  CL 106  GLUCOSE 97  BUN 16  CREATININE 0.90   Coagulation Profile:  Recent Labs Lab 10/28/16 1321  INR 0.97   Urine analysis:    Component Value Date/Time   COLORURINE YELLOW 10/28/2016 [place] 10/28/2016 1321   LABSPEC 1.019 10/28/2016 1321   PHURINE 5.0 10/28/2016 1321   [place] 10/28/2016 1321   [place] 10/28/2016 1321   [place] 10/28/2016 1321   KETONESUR NEGATIVE 10/28/2016 1321   PROTEINUR NEGATIVE 10/28/2016 1321   UROBILINOGEN 0.2 11/03/2009 0200   NITRITE NEGATIVE 10/28/2016 1321   LEUKOCYTESUR  NEGATIVE 10/28/2016 1321   Radiological Exams on Admission: Ct Head Wo Contrast  Result Date: 10/28/2016 CLINICAL DATA:  Intermittent RIGHT-sided weakness for 2 weeks, prior data set beginning at 11 a.m. EXAM: CT HEAD WITHOUT CONTRAST TECHNIQUE: Contiguous axial images were obtained from the base of the skull through the vertex without intravenous contrast. COMPARISON:  MRI of the head November 03, 2009 FINDINGS: BRAIN: No intraparenchymal hemorrhage, mass effect nor midline shift. New small LEFT frontal LEFT parietal lobe infarcts. Old bilateral basal ganglia infarcts with mild ex vacuo dilatation suggests and ventricles, ventricles and sulci are overall normal for patient's age. The RIGHT frontal lobe encephalomalacia present on prior MRI. Patchy supratentorial white matter hypodensities progressed from prior imaging. No abnormal extra-axial fluid collections. Basal cisterns are patent. VASCULAR: Moderate calcific atherosclerosis of the carotid siphons. Slightly dense middle cerebral artery's, however this is relatively symmetric and likely from hemoconcentration. SKULL: No skull fracture. No significant scalp soft tissue swelling. SINUSES/ORBITS: The mastoid air-cells  and included paranasal sinuses are well-aerated.The included ocular globes and orbital contents are non-suspicious. OTHER: None. IMPRESSION: 1. New small LEFT frontoparietal lobe/MCA territory infarcts could be acute given patient's symptoms. 2. Old RIGHT frontal/MCA territory infarct. Old basal ganglia infarcts. 3. Moderate chronic small vessel ischemic disease, advanced from 2011. 4. Moderate atherosclerosis carotid siphon, advanced for age. Critical Value/emergent results were called by telephone at the time of interpretation on 10/28/2016 at 1:47 pm to Dr. Carmin Muskrat , who verbally acknowledged these results. Electronically Signed   By: Elon Alas M.D.   On: 10/28/2016 13:48   Mr Jodene Nam Head Wo Contrast  Result Date:  10/28/2016 CLINICAL DATA:  Right arm weakness.  Stroke. EXAM: MRI HEAD WITHOUT CONTRAST MRA HEAD WITHOUT CONTRAST TECHNIQUE: Multiplanar, multiecho pulse sequences of the brain and surrounding structures were obtained without intravenous contrast. Angiographic images of the head were obtained using MRA technique without contrast. COMPARISON:  CT 10/28/2016 FINDINGS: MRI HEAD FINDINGS Brain: Patchy areas of acute infarct in the left frontal lobe extending posteriorly into the deep white matter and to the left occipital parietal lobe. This is in the watershed territory. Negative for hemorrhage. No other acute infarct. Negative for mass or edema. Chronic microvascular ischemic change in the white matter bilaterally. Chronic right frontal convexity infarct. Chronic infarct left thalamus and in the central pons. Vascular: Normal arterial flow voids bilaterally. Skull and upper cervical spine: Negative Sinuses/Orbits: Negative Other: None MRA HEAD FINDINGS Both vertebral arteries are widely patent. Basilar widely patent. Superior cerebellar and posterior cerebral arteries patent Internal carotid artery widely patent bilaterally. Anterior and middle cerebral arteries appear widely patent Negative for cerebral aneurysm. IMPRESSION: Acute watershed infarct on the left in the frontal and parietal lobe. Negative for hemorrhage Atrophy with moderate to advanced chronic ischemic change Negative MRA head Electronically Signed   By: Franchot Gallo M.D.   On: 10/28/2016 15:03   Mr Brain Wo Contrast  Result Date: 10/28/2016 CLINICAL DATA:  Right arm weakness.  Stroke. EXAM: MRI HEAD WITHOUT CONTRAST MRA HEAD WITHOUT CONTRAST TECHNIQUE: Multiplanar, multiecho pulse sequences of the brain and surrounding structures were obtained without intravenous contrast. Angiographic images of the head were obtained using MRA technique without contrast. COMPARISON:  CT 10/28/2016 FINDINGS: MRI HEAD FINDINGS Brain: Patchy areas of acute infarct  in the left frontal lobe extending posteriorly into the deep white matter and to the left occipital parietal lobe. This is in the watershed territory. Negative for hemorrhage. No other acute infarct. Negative for mass or edema. Chronic microvascular ischemic change in the white matter bilaterally. Chronic right frontal convexity infarct. Chronic infarct left thalamus and in the central pons. Vascular: Normal arterial flow voids bilaterally. Skull and upper cervical spine: Negative Sinuses/Orbits: Negative Other: None MRA HEAD FINDINGS Both vertebral arteries are widely patent. Basilar widely patent. Superior cerebellar and posterior cerebral arteries patent Internal carotid artery widely patent bilaterally. Anterior and middle cerebral arteries appear widely patent Negative for cerebral aneurysm. IMPRESSION: Acute watershed infarct on the left in the frontal and parietal lobe. Negative for hemorrhage Atrophy with moderate to advanced chronic ischemic change Negative MRA head Electronically Signed   By: Franchot Gallo M.D.   On: 10/28/2016 15:03    EKG: Independently reviewed.   Assessment/Plan Principal Problem:   Acute CVA (cerebrovascular accident) ([place]) Active Problems:   HLD (hyperlipidemia)   CAD, NATIVE VESSEL   HTN (hypertension)   PLAN:   Acute Left non dominant hemispheric CVA:  Speech and comprehension are normal.  Severe almost placid right hemipleparesis, and right lower extremity.  Will add ASA to his plavix, allow permissive HTN, and obtain Lipid profile for secondary prevention.  Obtain ECHO, [place] of carotid, and will consult neurology here if possible.  Will obtain PT/OT, and determine if he will require STR.   HLD:  Will start Lipitor 80mg  per day. Check lipid in the morning.   HTN:  Hold ACE I.  Continue with coreg, and allow permissive HTN.  DVT prophylaxis: subQ Heparin.  Code Status: FULL CODE.  Family Communication: friend at bedside with hsi permission.   Disposition  Plan:  Consults called: teleneurology for code stroke.  Admission status: inpatient.    Jordyn Hofacker MD FACP. [facility]  If 7PM-7AM, please contact night-coverage [email or web address] Password [email or web address]  10/28/2016, 4:08 PM

## 2016-10-28 NOTE — ED Notes (Signed)
Patient transported to MRI 

## 2016-10-28 NOTE — ED Notes (Signed)
Dr. Marin Comment made aware of pt's BP of 173/114, no need for further orders per MD.

## 2016-10-28 NOTE — ED Notes (Signed)
Lipitor 80mg  PO not available in ED.

## 2016-10-28 NOTE — Plan of Care (Signed)
Problem: Education: Goal: Knowledge of [facility] Education information/materials will improve We have discussed the possible causes for this stroke. Patient is aware he has some thing in his life he could change to help decrease the chance of having another stroke.

## 2016-10-29 ENCOUNTER — Inpatient Hospital Stay (HOSPITAL_BASED_OUTPATIENT_CLINIC_OR_DEPARTMENT_OTHER): Payer: BLUE CROSS/BLUE SHIELD

## 2016-10-29 ENCOUNTER — Other Ambulatory Visit (HOSPITAL_COMMUNITY): Payer: BLUE CROSS/BLUE SHIELD

## 2016-10-29 ENCOUNTER — Inpatient Hospital Stay (HOSPITAL_COMMUNITY): Payer: BLUE CROSS/BLUE SHIELD

## 2016-10-29 ENCOUNTER — Encounter: Payer: Self-pay | Admitting: Vascular Surgery

## 2016-10-29 DIAGNOSIS — I639 Cerebral infarction, unspecified: Principal | ICD-10-CM

## 2016-10-29 DIAGNOSIS — I6789 Other cerebrovascular disease: Secondary | ICD-10-CM

## 2016-10-29 LAB — ECHOCARDIOGRAM COMPLETE
HEIGHTINCHES: 71 in
Weight: 2640 oz

## 2016-10-29 LAB — LIPID PANEL
CHOL/HDL RATIO: 4.1 ratio
CHOLESTEROL: 146 mg/dL (ref 0–200)
HDL: 36 mg/dL — AB (ref >40)
LDL Cholesterol: 89 mg/dL (ref 0–99)
TRIGLYCERIDES: 103 mg/dL (ref <150)
VLDL: 21 mg/dL (ref 0–40)

## 2016-10-29 MED ORDER — ATORVASTATIN CALCIUM 80 MG PO TABS: 80.0000 mg | ORAL_TABLET | Freq: Every day | ORAL | 2 refills | Status: DC

## 2016-10-29 MED ORDER — ASPIRIN 325 MG PO TABS: 325.0000 mg | ORAL_TABLET | Freq: Every day | ORAL | Status: DC

## 2016-10-29 NOTE — Evaluation (Signed)
Occupational Therapy Evaluation Patient Details Name: Ethan Fields MRN: [id number] DOB: 04-30-1955 Today's Date: 10/29/2016    History of Present Illness Ethan Fields is an right handed 61 y.o. male with hx of CAD (no stent), prior MI, HTN, HLD, prior tobacco use quit 8 years ago, prior CVA, presented to the ER with stuttering onset of right arm and leg weakness, then total weakness yesterday.  His ictus was over 12 hours PTA.  He has no HA, facial symptoms, has normal speech and comprehension.  Work up in the ER included a head CT, followed by MRI and MRA of his brain.  These showed new small LEFT frontoparietal lobe/MCA territory infarcts could be acute given patient's symptoms along with old RIGHT frontal/MCA territory infarct. Old basal ganglia infarcts. He also has moderate chronic small vessel ischemic disease, advanced from 2011.  His MRA showed no large vessel occlusion.  He has been compliant with his Plavix, though never was on ASA.  He has no allergy to ASA.  Code stroke was called, and no TPA was recommended, as ictus was clearly out of its window.  Hospitalist was asked to admit him for further stroke work up   Clinical Impression   Pt received semi-reclined in bed, agreeable to OT evaluation. Pt reports he is better able to use his RUE, however it still feels "heavy." During evaluation pt demonstrates RUE deficits (see below), however is able to use RUE with increased time. While standing at sink pt with episode of urinary incontinence and reports he was not aware that he needed to urinate, reports he has "been unable to hold my water" for approximately 2 weeks. Pt requires occasional cuing to attempt tasks using RUE, pt frustrated with increased time required. Discussed rehab options with pt, pt is interested in outpatient rehabilitation however is unsure if he would have a ride. Recommend outpatient OT or HHOT dependent upon pt transportation availability.     Follow Up  Recommendations  Home health OT;Outpatient OT;Supervision - Intermittent    Equipment Recommendations  Tub/shower seat       Precautions / Restrictions Precautions Precautions: Fall Restrictions Weight Bearing Restrictions: No      Mobility Bed Mobility Overal bed mobility: Modified Independent                Transfers Overall transfer level: Independent   Transfers: Sit to/from Stand Sit to Stand: Independent                  ADL either performed or assessed with clinical judgement   ADL Overall ADL's : Needs assistance/impaired Eating/Feeding: Set up;Sitting   Grooming: Wash/dry hands;Wash/dry face;Supervision/safety;Standing               Lower Body Dressing: Modified independent;Sitting/lateral leans               Functional mobility during ADLs: Supervision/safety;Rolling walker       Vision Baseline Vision/History: No visual deficits Patient Visual Report: No change from baseline Vision Assessment?: No apparent visual deficits            Pertinent Vitals/Pain Pain Assessment: No/denies pain     Hand Dominance Right   Extremity/Trunk Assessment Upper Extremity Assessment Upper Extremity Assessment: RUE deficits/detail RUE Deficits / Details: Strength: shoulder 3+/5, elbow and wrist 4/5; grip is good RUE Coordination: decreased gross motor;decreased fine motor   Lower Extremity Assessment Lower Extremity Assessment: Defer to PT evaluation   Cervical / Trunk Assessment Cervical / Trunk Assessment: Normal  Communication Communication Communication: No difficulties   Cognition Arousal/Alertness: Awake/alert Behavior During Therapy: WFL for tasks assessed/performed Overall Cognitive Status: Within Functional Limits for tasks assessed                                                Home Living   Living Arrangements: Alone   Type of Home: House Home Access: Stairs to enter Entrance Stairs-Number of  Steps: 3 Entrance Stairs-Rails: None Home Layout: One level     Bathroom Shower/Tub: Teacher, early years/pre: Standard     Home Equipment: None          Prior Functioning/Environment Level of Independence: Independent        Comments: Pt independent in ADL completion and functional mobility; driving        OT Problem List: Decreased strength;Decreased activity tolerance;Impaired balance (sitting and/or standing);Decreased coordination;Impaired UE functional use      OT Treatment/Interventions: Self-care/ADL training;Therapeutic exercise;Neuromuscular education;DME and/or AE instruction;Therapeutic activities;Patient/family education    OT Goals(Current goals can be found in the care plan section) Acute Rehab OT Goals Patient Stated Goal: to be able to use my right arm OT Goal Formulation: With patient Time For Goal Achievement: 11/12/16 Potential to Achieve Goals: Good  OT Frequency: Min 2X/week    AM-PAC PT "6 Clicks" Daily Activity     Outcome Measure Help from another person eating meals?: A Little Help from another person taking care of personal grooming?: A Little Help from another person toileting, which includes using toliet, bedpan, or urinal?: A Little Help from another person bathing (including washing, rinsing, drying)?: A Little Help from another person to put on and taking off regular upper body clothing?: A Little Help from another person to put on and taking off regular lower body clothing?: A Little 6 Click Score: 18   End of Session Equipment Utilized During Treatment: Gait belt;Rolling walker  Activity Tolerance: Patient tolerated treatment well Patient left: in bed;with call bell/phone within reach;with nursing/sitter in room  OT Visit Diagnosis: Muscle weakness (generalized) (M62.81);Ataxia, unspecified (R27.0)                Time: 2094-7096 OT Time Calculation (min): 45 min Charges:  OT General Charges $OT Visit: 1 Procedure OT  Evaluation $OT Eval Low Complexity: 1 Procedure    Guadelupe Sabin, OTR/L  [phone] 10/29/2016, 9:38 AM

## 2016-10-29 NOTE — Consult Note (Signed)
[place] A. Merlene Laughter, MD     [email or web address]          Ethan Fields is an 61 y.o. male.   ASSESSMENT/PLAN: 1. Multiple infarcts involving the left hemisphere - risk factors age, hypertension, dyslipidemia and a coronary artery disease: This is due to significant extracranial left ICA stenosis. Dual antiplatelet agents are recommended along with the lipid-lowering agents/statin medication, blood pressure control and the blood sugar control. However, the patient should be referred to vascular surgery for a urgent revascularization procedure/endarterectomy. The patient should have this done sooner rather than later given the progressive and stuttering course of his spells of right-sided weakness.     The patient is 61 year old right-handed black male who presents with a stuttering history of right upper extremity and right leg weakness over the past 2 weeks. The patient reports that the right upper extremity is particularly problematic. It appears that symptoms got worse yesterday and therefore he decided to seek medical attention. The patient was not a candidate for thromolysis or thrombectomy because of the delayed presentation. He has remote history of nicotine use but not recently.  He does not report difficulties with speaking. He does not report dizziness, headaches, shortness of breath, GI GU symptoms. The review of systems otherwise negative. He tells me he has been compliant with all medications and treatment.   GENERAL: This is a thin very pleasant male in no acute distress.  HEENT: Supple. Atraumatic normocephalic.    ABDOMEN: soft  EXTREMITIES: No edema   BACK: Normal.  SKIN: Normal by inspection.    MENTAL STATUS: Alert and oriented. Speech, language (he has good fluency, naming and comprehension) and cognition are generally intact. Judgment and insight normal.   CRANIAL NERVES: Pupils are equal, round and reactive to light and accommodation;  extra ocular movements are full, there is no significant nystagmus; visual fields are full; upper and lower facial muscles are normal in strength and symmetric, there is no flattening of the nasolabial folds; tongue is midline; uvula is midline; shoulder elevation is normal.   MOTOR: Right deltoid is 4+/5. The other muscles of the right upper extremity including biceps and triceps and handgrip are 5 although fine finger movements and tapping are mildly impaired. There is mild pronator drift. The right leg shows normal tone, bulk and strength. There is no drift involving the right leg. There is no drift of the left upper or lower extremity. The left side shows normal tone, bulk and strength.  COORDINATION: Left finger to nose is normal, right finger to nose is normal, No rest tremor; no intention tremor; no postural tremor; no bradykinesia.  REFLEXES: Deep tendon reflexes are symmetrical and normal. Babinski reflexes are flexor bilaterally.   SENSATION: Normal to light touch. There is no visual or tactile extinction on double simultaneous stimulation.  NIH stroke scale 1.      Blood pressure (!) 164/88, pulse 62, temperature 98 F (36.7 C), temperature source Oral, resp. rate 18, height 5' 11"  (1.803 m), weight 165 lb (74.8 kg), SpO2 100 %.  Past Medical History:  Diagnosis Date  . CAD (coronary artery disease)   . Hyperlipidemia   . Hypertension   . Myocardial infarct ([place]) 2010  . Stroke [facility]) 2011   TIA    History reviewed. No pertinent surgical history.  History reviewed. No pertinent family history.  Social History:  reports that he quit smoking about 8 years ago. His smoking use included Cigarettes. He has never used smokeless tobacco.  He reports that he drinks alcohol. He reports that he uses drugs, including Marijuana.  Allergies: No Known Allergies  Medications: Prior to Admission medications   Medication Sig Start Date End Date Taking? Authorizing Provider  amLODipine  (NORVASC) 5 MG tablet Take 5 mg by mouth daily. 09/27/16  Yes [provider]  atorvastatin (LIPITOR) 20 MG tablet Take 20 mg by mouth daily. 10/04/16  Yes [provider]  carvedilol (COREG) 6.25 MG tablet Take 6.25 mg by mouth 2 (two) times daily with a meal.   Yes [provider]  clopidogrel (PLAVIX) 75 MG tablet Take 75 mg by mouth daily.   Yes [provider]  lisinopril (PRINIVIL,ZESTRIL) 10 MG tablet Take 10 mg by mouth daily.   Yes [provider]  methylPREDNISolone (MEDROL DOSEPAK) 4 MG TBPK tablet Take 1-7 tablets by mouth daily. Instructions: on day 1 take 7 tablets, on day 2 take 6 tablets, on day 3 take 5 tablets, on day 4 take 4 tablets, on day 5 take 3 tablets, on day 6 take 2 tablets, and on day 7 take 1 tablet 10/24/16  Yes [provider]    Scheduled Meds: .  stroke: mapping our early stages of recovery book   Does not apply Once  . aspirin  300 mg Rectal Daily   Or  . aspirin  325 mg Oral Daily  . atorvastatin  80 mg Oral q1800  . carvedilol  6.25 mg Oral BID WC  . clopidogrel  75 mg Oral Daily  . heparin  5,000 Units Subcutaneous Q8H   Continuous Infusions: . sodium chloride 50 mL/hr at 10/28/16 1813   PRN Meds:.acetaminophen **OR** acetaminophen (TYLENOL) oral liquid 160 mg/5 mL **OR** acetaminophen, senna-docusate     Results for orders placed or performed during the hospital encounter of 10/28/16 (from the past 48 hour(s))  APTT     Status: None   Collection Time: 10/28/16  1:21 PM  Result Value Ref Range   aPTT 27 24 - 36 seconds  CBC WITH DIFFERENTIAL     Status: Abnormal   Collection Time: 10/28/16  1:21 PM  Result Value Ref Range   WBC 7.7 4.0 - 10.5 K/uL   RBC 4.88 4.22 - 5.81 MIL/uL   Hemoglobin 15.6 13.0 - 17.0 g/dL   HCT 43.8 39.0 - 52.0 %   MCV 89.8 78.0 - 100.0 fL   MCH 32.0 26.0 - 34.0 pg   MCHC 35.6 30.0 - 36.0 g/dL   RDW 16.6 (H) 11.5 - 15.5 %   Platelets 225 150 - 400 K/uL   Neutrophils  Relative % 64 %   Neutro Abs 5.0 1.7 - 7.7 K/uL   Lymphocytes Relative 27 %   Lymphs Abs 2.1 0.7 - 4.0 K/uL   Monocytes Relative 9 %   Monocytes Absolute 0.7 0.1 - 1.0 K/uL   Eosinophils Relative 0 %   Eosinophils Absolute 0.0 0.0 - 0.7 K/uL   Basophils Relative 0 %   Basophils Absolute 0.0 0.0 - 0.1 K/uL  Ethanol     Status: None   Collection Time: 10/28/16  1:21 PM  Result Value Ref Range   Alcohol, Ethyl (B) <5 <5 mg/dL    Comment:        LOWEST DETECTABLE LIMIT FOR SERUM ALCOHOL IS 5 mg/dL FOR MEDICAL PURPOSES ONLY   Protime-INR     Status: None   Collection Time: 10/28/16  1:21 PM  Result Value Ref Range   Prothrombin Time 12.9  11.4 - 15.2 seconds   INR 0.97   Urine rapid drug screen (hosp performed)not at [facility]     Status: Abnormal   Collection Time: 10/28/16  1:21 PM  Result Value Ref Range   Opiates NONE DETECTED NONE DETECTED   Cocaine NONE DETECTED NONE DETECTED   Benzodiazepines NONE DETECTED NONE DETECTED   Amphetamines NONE DETECTED NONE DETECTED   Tetrahydrocannabinol POSITIVE (A) NONE DETECTED   Barbiturates NONE DETECTED NONE DETECTED    Comment:        DRUG SCREEN FOR MEDICAL PURPOSES ONLY.  IF CONFIRMATION IS NEEDED FOR ANY PURPOSE, NOTIFY LAB WITHIN 5 DAYS.        LOWEST DETECTABLE LIMITS FOR URINE DRUG SCREEN Drug Class       Cutoff (ng/mL) Amphetamine      1000 Barbiturate      200 Benzodiazepine   562 Tricyclics       130 Opiates          300 Cocaine          300 THC              50   Urinalysis, Routine w reflex microscopic (not at [facility])     Status: None   Collection Time: 10/28/16  1:21 PM  Result Value Ref Range   Color, Urine YELLOW YELLOW   APPearance CLEAR CLEAR   Specific Gravity, Urine 1.019 1.005 - 1.030   pH 5.0 5.0 - 8.0   Glucose, UA NEGATIVE NEGATIVE mg/dL   Hgb urine dipstick NEGATIVE NEGATIVE   Bilirubin Urine NEGATIVE NEGATIVE   Ketones, ur NEGATIVE NEGATIVE mg/dL   Protein, ur NEGATIVE NEGATIVE mg/dL   Nitrite  NEGATIVE NEGATIVE   Leukocytes, UA NEGATIVE NEGATIVE  I-stat troponin, ED     Status: None   Collection Time: 10/28/16  1:25 PM  Result Value Ref Range   Troponin i, poc 0.02 0.00 - 0.08 ng/mL   Comment 3            Comment: Due to the release kinetics of cTnI, a negative result within the first hours of the onset of symptoms does not rule out myocardial infarction with certainty. If myocardial infarction is still suspected, repeat the test at appropriate intervals.   I-stat chem 8, ed     Status: Abnormal   Collection Time: 10/28/16  1:28 PM  Result Value Ref Range   Sodium 140 135 - 145 mmol/L   Potassium 4.3 3.5 - 5.1 mmol/L   Chloride 106 101 - 111 mmol/L   BUN 16 6 - 20 mg/dL   Creatinine, Ser 0.90 0.61 - 1.24 mg/dL   Glucose, Bld 97 65 - 99 mg/dL   Calcium, Ion 1.06 (L) 1.15 - 1.40 mmol/L   TCO2 24 0 - 100 mmol/L   Hemoglobin 16.3 13.0 - 17.0 g/dL   HCT 48.0 39.0 - 52.0 %  Comprehensive metabolic panel     Status: None   Collection Time: 10/28/16  3:17 PM  Result Value Ref Range   Sodium 139 135 - 145 mmol/L   Potassium 4.1 3.5 - 5.1 mmol/L   Chloride 102 101 - 111 mmol/L   CO2 25 22 - 32 mmol/L   Glucose, Bld 95 65 - 99 mg/dL   BUN 12 6 - 20 mg/dL   Creatinine, Ser 0.86 0.61 - 1.24 mg/dL   Calcium 9.2 8.9 - 10.3 mg/dL   Total Protein 7.8 6.5 - 8.1 g/dL   Albumin 3.9 3.5 - 5.0  g/dL   AST 22 15 - 41 U/L   ALT 20 17 - 63 U/L   Alkaline Phosphatase 78 38 - 126 U/L   Total Bilirubin 0.7 0.3 - 1.2 mg/dL   GFR calc non Af Amer >60 >60 mL/min   GFR calc Af Amer >60 >60 mL/min    Comment: (NOTE) The eGFR has been calculated using the CKD EPI equation. This calculation has not been validated in all clinical situations. eGFR's persistently <60 mL/min signify possible Chronic Kidney Disease.    Anion gap 12 5 - 15  Troponin I     Status: None   Collection Time: 10/28/16  3:17 PM  Result Value Ref Range   Troponin I <0.03 <0.03 ng/mL    Studies/Results:  BRAIN  MRI/MRA FINDINGS: MRI HEAD FINDINGS  Brain: Patchy areas of acute infarct in the left frontal lobe extending posteriorly into the deep white matter and to the left occipital parietal lobe. This is in the watershed territory. Negative for hemorrhage. No other acute infarct.  Negative for mass or edema. Chronic microvascular ischemic change in the white matter bilaterally. Chronic right frontal convexity infarct. Chronic infarct left thalamus and in the central pons.  Vascular: Normal arterial flow voids bilaterally.  Skull and upper cervical spine: Negative  Sinuses/Orbits: Negative  Other: None  MRA HEAD FINDINGS  Both vertebral arteries are widely patent. Basilar widely patent. Superior cerebellar and posterior cerebral arteries patent Internal carotid artery widely patent bilaterally. Anterior and middle cerebral arteries appear widely patent Negative for cerebral aneurysm.  IMPRESSION: Acute watershed infarct on the left in the frontal and parietal lobe. Negative for hemorrhage  Atrophy with moderate to advanced chronic ischemic change  Negative MRA head     The brain MRI is reviewed in person and shows multiple subacute and cortical infarcts involving the left occipital parietal lobe and frontal lobe mostly in a parasagital watershed distribution. No hemorrhages appreciated. No evidence of encephalomalacia. MRA shows no large vessel occlusive disease.   ECHO - Left ventricle: The cavity size was normal. Wall thickness was   increased increased in a pattern of mild to moderate LVH.   Systolic function was normal. The estimated ejection fraction was   in the range of 55% to 60%. Wall motion was normal; there were no   regional wall motion abnormalities. Diastolic function is   abnormal, indeterminant grade. - Aortic valve: Mildly calcified annulus. Trileaflet; mildly   thickened leaflets. Valve area (VTI): 2.01 cm^2. Valve area   (Vmax): 2.42 cm^2. -  Technically adequate study.     CAROTID DOPPLERS RIGHT  ICA:  76 cm/sec  CCA:  59 cm/sec  SYSTOLIC ICA/CCA RATIO:  1.3  DIASTOLIC ICA/CCA RATIO:  2.5  ECA:  59 cm/sec  LEFT  ICA:  240 cm/sec  CCA:  65 cm/sec  SYSTOLIC ICA/CCA RATIO:  3.7  DIASTOLIC ICA/CCA RATIO:  4.3  ECA:  66 cm/sec  RIGHT CAROTID ARTERY: Little if any plaque in the bulb. Mild irregular plaque in the proximal internal carotid artery. Low resistance internal carotid Doppler pattern.  RIGHT VERTEBRAL ARTERY:  Antegrade.  LEFT CAROTID ARTERY: Moderate irregular plaque within the mid common carotid artery. Extensive calcified plaque in the bulb which shadows the lumen. Low resistance internal carotid Doppler pattern. There is turbulence. The internal carotid is moderately tortuous.  LEFT VERTEBRAL ARTERY:  Antegrade.  IMPRESSION: Less than 50% stenosis in the right internal carotid artery.  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  Barbarita Hutmacher A. Merlene Laughter, M.D.  Diplomate, Tax adviser of Psychiatry and Neurology ( Neurology). 10/29/2016, 8:30 AM

## 2016-10-29 NOTE — Care Management Note (Signed)
Case Management Note  Patient Details  Name: NEEL BUFFONE MRN: [id number] Date of Birth: October 02, 1955  Subjective/Objective:                  Admitted for CVA work up. Pt lives alone, has supportive family at the bedside. He is very ind at baseline. Pt with minimal deficits at this time. PT recommends no f/u, OT recommends OP vs HH follow up. Pt ambulated with PT unassisted but reports he would feel safer using a cane. He has no preference of DME agency but would like cane with straight handle. [facility] has this. Pt unsure at this time if he would like OT at DC.   Action/Plan: CM has faxed referral for cane to [facility] with instructions to deliver to pt room at hospital. CM will f/u with pt on decision regarding OT prior to DC.   Expected Discharge Date:  10/30/16               Expected Discharge Plan:  Home/Self Care  In-House Referral:  NA  Discharge planning Services  CM Consult  Post Acute Care Choice:  Durable Medical Equipment Choice offered to:  Patient  DME Arranged:  Kasandra Knudsen DME Agency:  [facility]  Status of Service:  In process, will continue to follow  Sherald Barge, RN 10/29/2016, 2:44 PM

## 2016-10-29 NOTE — Progress Notes (Signed)
SLP Cancellation Note  Patient Details Name: Ethan Fields MRN: [id number] DOB: [birth year]   Cancelled treatment:       Reason Eval/Treat Not Completed: Patient at procedure or test/unavailable   Elvina Sidle, M.S., CCC-SLP 10/29/2016, 10:06 AM

## 2016-10-29 NOTE — Evaluation (Addendum)
Physical Therapy Evaluation Patient Details Name: Ethan Fields MRN: [id number] DOB: 02-13-1956 Today's Date: 10/29/2016   History of Present Illness  Ethan Fields is a 61yo black male who comes to [facility] on 7/10 Fields Rt sided UE weakness and RLE buckling. PMH: CAD, MI, HTN, HLD, CVA, TIA (per patient) 2011. Imagign studies reveal acute Left frontoparietal infarct, old Right frontoparietal infarct, and old basal ganglia infarcts. PTA pt lives alone, works as an Community education officer, drives, AMB community distances without limitation or AD.   Clinical Impression  Pt admitted with above diagnosis. Pt currently with functional limitations due to the deficits listed below (see "PT Problem List"). At evaluation, pt is received semirecumbent in bed upon entry, no family/caregiver present. Condom cath is not attached, and as patient mobilizes to EOB, IV access becomes detached from pump: RN notified: The pt is awake and agreeable to participate. No acute distress noted at this time. VSS during eval.  Cognition:The pt is alert and oriented x3, pleasant, conversational, and following simple and multi-step commands consistently; provides history as asked.  Strength/MMT:  Not performed for LE; see OT note for RUE detail.  Functional mobility: symmetrical, good confidence, no LOB, no difficulty with bed mobility, transfers, and AMB.  Tone: Tone graded using the modified Ashworth Scale reveals the follow:  Joint Proprioception: Not tested Light Touch Sensation: mildly altered at Rt leg, Rt trunk, and Rt face without gradation.  Balance: Intact, no LOB during screening, at baseline per patient; improved since knee buckling 2DA.  Speech: At baseline, WNL Patient presenting with impairment of Rt hemi light touch sensation, but not enough to create functional deficits or safety concerns at this time. CC of Rt leg weakness has resolved mostly at this time. RUE remains impaired per OT assessment this AM and pt report. No  skilled PT needs at this time. PT signing off.     Follow Up Recommendations No PT follow up    Equipment Recommendations  None recommended by PT    Recommendations for Other Services Speech consult (Cognitive screening)     Precautions / Restrictions Precautions Precautions: Fall Restrictions Weight Bearing Restrictions: No      Mobility  Bed Mobility Overal bed mobility: Independent                Transfers Overall transfer level: Independent                  Ambulation/Gait Ambulation/Gait assistance: Independent Ambulation Distance (Feet): 500 Feet Assistive device: None   Gait velocity: 0.4m/s    General Gait Details: reports to be near baseline, improved since 1DA  Stairs            Wheelchair Mobility    Modified Rankin (Stroke Patients Only)       Balance Overall balance assessment: Independent (narrow stance eyew closed, forward reach, lift from floor, all WNL, no LOB. 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)       Hand Dominance   Dominant Hand: Right    Extremity/Trunk Assessment   Upper Extremity Assessment Upper Extremity Assessment: RUE deficits/detail (from the OT note earlier today: ) RUE Deficits / Details: Strength: shoulder 3+/5, elbow and wrist 4/5; grip is good RUE Coordination: decreased gross motor;decreased fine  motor    Lower Extremity Assessment Lower Extremity Assessment: RLE deficits/detail;Overall WFL for tasks assessed RLE Deficits / Details: gait appears intact, symetrical, WNL RLE Sensation: decreased light touch    Cervical / Trunk Assessment Cervical / Trunk Assessment:  (decreased light touch on right side, trunk and face)  Communication   Communication: No difficulties  Cognition Arousal/Alertness: Awake/alert Behavior During Therapy: WFL for tasks assessed/performed Overall Cognitive Status: Within Functional Limits for tasks assessed                                        General Comments      Exercises     Assessment/Plan    PT Assessment Patent does not need any further PT services  PT Problem List         PT Treatment Interventions      PT Goals (Current goals can be found in the Care Plan section)  Acute Rehab PT Goals PT Goal Formulation: All assessment and education complete, DC therapy    Frequency     Barriers to discharge        Co-evaluation               AM-PAC PT "6 Clicks" Daily Activity  Outcome Measure Difficulty turning over in bed (including adjusting bedclothes, sheets and blankets)?: None Difficulty moving from lying on back to sitting on the side of the bed? : None Difficulty sitting down on and standing up from a chair with arms (e.g., wheelchair, bedside commode, etc,.)?: None Help needed moving to and from a bed to chair (including a wheelchair)?: None Help needed walking in hospital room?: None Help needed climbing 3-5 steps with a railing? : None 6 Click Score: 24    End of Session Equipment Utilized During Treatment: Gait belt Activity Tolerance: Patient tolerated treatment well Patient left: in bed;with call bell/phone within reach;with bed alarm set Nurse Communication: Mobility status PT Visit Diagnosis: Hemiplegia and hemiparesis Hemiplegia - Right/Left: Right Hemiplegia - dominant/non-dominant:  Dominant Hemiplegia - caused by: Cerebral infarction    Time: 1042-1100 PT Time Calculation (min) (ACUTE ONLY): 18 min   Charges:   PT Evaluation $PT Eval Low Complexity: 1 Procedure PT Treatments $Therapeutic Activity: 8-22 mins   PT G Codes:        1:20 PM, 21-Nov-2016 Etta Grandchild, PT, DPT Physical Therapist - [place] [phone] (Office)    Ethan Fields 2016/11/21, 1:12 PM

## 2016-10-29 NOTE — Progress Notes (Signed)
*  PRELIMINARY RESULTS* Echocardiogram 2D Echocardiogram has been performed.  Leavy Cella 10/29/2016, 10:10 AM

## 2016-10-29 NOTE — Discharge Summary (Signed)
Physician Discharge Summary  CLAUDIA ALVIZO DJS:[id number] DOB: 03/30/1956 DOA: 10/28/2016  PCP: Monico Blitz, MD  Admit date: 10/28/2016 Discharge date: 10/29/2016  Time spent: 45 minutes  Recommendations for Outpatient Follow-up:  -Will be discharged home today. -Advised to follow up with vascular surgery, Dr. Oneida Alar. His office will schedule appointment.   Discharge Diagnoses:  Principal Problem:   Acute CVA (cerebrovascular accident) ([place]) Active Problems:   HLD (hyperlipidemia)   CAD, NATIVE VESSEL   HTN (hypertension)   Discharge Condition: Stable and improved  Filed Weights   10/28/16 1320  Weight: 74.8 kg (165 lb)    History of present illness:   As per Dr. Marin Comment on 7/10: BERYL BALZ is an right handed 61 y.o. male with hx of CAD (no stent), prior MI, HTN, HLD, prior tobacco use quit 8 years ago, prior CVA, presented to the ER with stuttering onset of right arm and leg weakness, then total weakness yesterday.  His ictus was over 12 hours PTA.  He has no HA, facial symptoms, has normal speech and comprehension.  Work up in the ER included a head CT, followed by MRI and MRA of his brain.  These showed new small LEFT frontoparietal lobe/MCA territory infarcts could be acute given patient's symptoms along with old RIGHT frontal/MCA territory infarct. Old basal ganglia infarcts. He also has moderate chronic small vessel ischemic disease, advanced from 2011.  His MRA showed no large vessel occlusion.  He has been compliant with his Plavix, though never was on ASA.  He has no allergy to ASA.  Code stroke was called, and no TPA was recommended, as ictus was clearly out of its window.  Hospitalist was asked to admit him for further stroke work up.   Hospital Course:   Acute CVA -Left brain acute watershed infarct in the frontal and parietal lobe. -Carotid dopplers with left ICA >70% stenosis. Discussed with Dr. Oneida Alar: ASA+plavix+statin and OP follow up in the coming weeks  that his office will schedule for potential CEA. -Lipitor increased from 20 to 80 mg. -ECHO: EF 55-60%, indeterminant grade DD, no PFO observed.  HTN -Fair control. -Continue coreg and norvasc.  Procedures:  None   Consultations:  None  Discharge Instructions  Discharge Instructions    Diet - low sodium heart healthy    Complete by:  As directed    Increase activity slowly    Complete by:  As directed      Allergies as of 10/29/2016   No Known Allergies     Medication List    TAKE these medications   amLODipine 5 MG tablet Commonly known as:  NORVASC Take 5 mg by mouth daily.   aspirin 325 MG tablet Take 1 tablet (325 mg total) by mouth daily. Start taking on:  10/30/2016   atorvastatin 80 MG tablet Commonly known as:  LIPITOR Take 1 tablet (80 mg total) by mouth daily at 6 PM. What changed:  medication strength  how much to take  when to take this   carvedilol 6.25 MG tablet Commonly known as:  COREG Take 6.25 mg by mouth 2 (two) times daily with a meal.   clopidogrel 75 MG tablet Commonly known as:  PLAVIX Take 75 mg by mouth daily.   lisinopril 10 MG tablet Commonly known as:  PRINIVIL,ZESTRIL Take 10 mg by mouth daily.   methylPREDNISolone 4 MG Tbpk tablet Commonly known as:  MEDROL DOSEPAK Take 1-7 tablets by mouth daily. Instructions: on day 1  take 7 tablets, on day 2 take 6 tablets, on day 3 take 5 tablets, on day 4 take 4 tablets, on day 5 take 3 tablets, on day 6 take 2 tablets, and on day 7 take 1 tablet            Durable Medical Equipment        Start     Ordered   10/29/16 1627  For home use only DME 3 n 1  Once     10/29/16 1626   10/29/16 1627  For home use only DME Shower stool  Once     10/29/16 1626   10/29/16 1355  For home use only DME Cane  Once     10/29/16 1355     No Known Allergies Follow-up Information    Monico Blitz, MD. Schedule an appointment as soon as possible for a visit in 2 week(s).   Specialty:   Internal Medicine Contact information: [place] [address] [phone]        Elam Dutch, MD Follow up.   Specialties:  Vascular Surgery, Cardiology Why:  office will call with appointment Contact information: [place] [address] [phone]            The results of significant diagnostics from this hospitalization (including imaging, microbiology, ancillary and laboratory) are listed below for reference.    Significant Diagnostic Studies: Ct Head Wo Contrast  Result Date: 10/28/2016 CLINICAL DATA:  Intermittent RIGHT-sided weakness for 2 weeks, prior data set beginning at 11 a.m. EXAM: CT HEAD WITHOUT CONTRAST TECHNIQUE: Contiguous axial images were obtained from the base of the skull through the vertex without intravenous contrast. COMPARISON:  MRI of the head November 03, 2009 FINDINGS: BRAIN: No intraparenchymal hemorrhage, mass effect nor midline shift. New small LEFT frontal LEFT parietal lobe infarcts. Old bilateral basal ganglia infarcts with mild ex vacuo dilatation suggests and ventricles, ventricles and sulci are overall normal for patient's age. The RIGHT frontal lobe encephalomalacia present on prior MRI. Patchy supratentorial white matter hypodensities progressed from prior imaging. No abnormal extra-axial fluid collections. Basal cisterns are patent. VASCULAR: Moderate calcific atherosclerosis of the carotid siphons. Slightly dense middle cerebral artery's, however this is relatively symmetric and likely from hemoconcentration. SKULL: No skull fracture. No significant scalp soft tissue swelling. SINUSES/ORBITS: The mastoid air-cells and included paranasal sinuses are well-aerated.The included ocular globes and orbital contents are non-suspicious. OTHER: None. IMPRESSION: 1. New small LEFT frontoparietal lobe/MCA territory infarcts could be acute given patient's symptoms. 2. Old RIGHT frontal/MCA territory infarct. Old basal ganglia infarcts.  3. Moderate chronic small vessel ischemic disease, advanced from 2011. 4. Moderate atherosclerosis carotid siphon, advanced for age. Critical Value/emergent results were called by telephone at the time of interpretation on 10/28/2016 at 1:47 pm to Dr. Carmin Muskrat , who verbally acknowledged these results. Electronically Signed   By: Elon Alas M.D.   On: 10/28/2016 13:48   Mr Jodene Nam Head Wo Contrast  Result Date: 10/28/2016 CLINICAL DATA:  Right arm weakness.  Stroke. EXAM: MRI HEAD WITHOUT CONTRAST MRA HEAD WITHOUT CONTRAST TECHNIQUE: Multiplanar, multiecho pulse sequences of the brain and surrounding structures were obtained without intravenous contrast. Angiographic images of the head were obtained using MRA technique without contrast. COMPARISON:  CT 10/28/2016 FINDINGS: MRI HEAD FINDINGS Brain: Patchy areas of acute infarct in the left frontal lobe extending posteriorly into the deep white matter and to the left occipital parietal lobe. This is in the watershed territory. Negative for  hemorrhage. No other acute infarct. Negative for mass or edema. Chronic microvascular ischemic change in the white matter bilaterally. Chronic right frontal convexity infarct. Chronic infarct left thalamus and in the central pons. Vascular: Normal arterial flow voids bilaterally. Skull and upper cervical spine: Negative Sinuses/Orbits: Negative Other: None MRA HEAD FINDINGS Both vertebral arteries are widely patent. Basilar widely patent. Superior cerebellar and posterior cerebral arteries patent Internal carotid artery widely patent bilaterally. Anterior and middle cerebral arteries appear widely patent Negative for cerebral aneurysm. IMPRESSION: Acute watershed infarct on the left in the frontal and parietal lobe. Negative for hemorrhage Atrophy with moderate to advanced chronic ischemic change Negative MRA head Electronically Signed   By: Franchot Gallo M.D.   On: 10/28/2016 15:03   Mr Brain Wo Contrast  Result  Date: 10/28/2016 CLINICAL DATA:  Right arm weakness.  Stroke. EXAM: MRI HEAD WITHOUT CONTRAST MRA HEAD WITHOUT CONTRAST TECHNIQUE: Multiplanar, multiecho pulse sequences of the brain and surrounding structures were obtained without intravenous contrast. Angiographic images of the head were obtained using MRA technique without contrast. COMPARISON:  CT 10/28/2016 FINDINGS: MRI HEAD FINDINGS Brain: Patchy areas of acute infarct in the left frontal lobe extending posteriorly into the deep white matter and to the left occipital parietal lobe. This is in the watershed territory. Negative for hemorrhage. No other acute infarct. Negative for mass or edema. Chronic microvascular ischemic change in the white matter bilaterally. Chronic right frontal convexity infarct. Chronic infarct left thalamus and in the central pons. Vascular: Normal arterial flow voids bilaterally. Skull and upper cervical spine: Negative Sinuses/Orbits: Negative Other: None MRA HEAD FINDINGS Both vertebral arteries are widely patent. Basilar widely patent. Superior cerebellar and posterior cerebral arteries patent Internal carotid artery widely patent bilaterally. Anterior and middle cerebral arteries appear widely patent Negative for cerebral aneurysm. IMPRESSION: Acute watershed infarct on the left in the frontal and parietal lobe. Negative for hemorrhage Atrophy with moderate to advanced chronic ischemic change Negative MRA head Electronically Signed   By: Franchot Gallo M.D.   On: 10/28/2016 15:03   US Carotid Bilateral (at [facility] Only)  Result Date: 10/29/2016 CLINICAL DATA:  Acute CVA.  Right-sided weakness for 2 weeks. EXAM: BILATERAL CAROTID DUPLEX ULTRASOUND TECHNIQUE: Pearline Cables scale imaging, color Doppler and duplex ultrasound were performed of bilateral carotid and vertebral arteries in the neck. COMPARISON:  None. FINDINGS: Criteria: Quantification of carotid stenosis is based on velocity parameters that correlate the residual  internal carotid diameter with NASCET-based stenosis levels, using the diameter of the distal internal carotid lumen as the denominator for stenosis measurement. The following velocity measurements were obtained: RIGHT ICA:  76 cm/sec CCA:  59 cm/sec SYSTOLIC ICA/CCA RATIO:  1.3 DIASTOLIC ICA/CCA RATIO:  2.5 ECA:  59 cm/sec LEFT ICA:  240 cm/sec CCA:  65 cm/sec SYSTOLIC ICA/CCA RATIO:  3.7 DIASTOLIC ICA/CCA RATIO:  4.3 ECA:  66 cm/sec RIGHT CAROTID ARTERY: Little if any plaque in the bulb. Mild irregular plaque in the proximal internal carotid artery. Low resistance internal carotid Doppler pattern. RIGHT VERTEBRAL ARTERY:  Antegrade. LEFT CAROTID ARTERY: Moderate irregular plaque within the mid common carotid artery. Extensive calcified plaque in the bulb which shadows the lumen. Low resistance internal carotid Doppler pattern. There is turbulence. The internal carotid is moderately tortuous. LEFT VERTEBRAL ARTERY:  Antegrade. IMPRESSION: Less than 50% stenosis in the right internal carotid artery. 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 Electronically Signed   By: Marybelle Killings M.D.   On: 10/29/2016  11:53    Microbiology: No results found for this or any previous visit (from the past 240 hour(s)).   Labs: Basic Metabolic Panel:  Recent Labs Lab 10/28/16 1328 10/28/16 1517  NA 140 139  K 4.3 4.1  CL 106 102  CO2  --  25  GLUCOSE 97 95  BUN 16 12  CREATININE 0.90 0.86  CALCIUM  --  9.2   Liver Function Tests:  Recent Labs Lab 10/28/16 1517  AST 22  ALT 20  ALKPHOS 78  BILITOT 0.7  PROT 7.8  ALBUMIN 3.9   No results for input(s): LIPASE, AMYLASE in the last 168 hours. No results for input(s): AMMONIA in the last 168 hours. CBC:  Recent Labs Lab 10/28/16 1321 10/28/16 1328  WBC 7.7  --   NEUTROABS 5.0  --   HGB 15.6 16.3  HCT 43.8 48.0  MCV 89.8  --   PLT 225  --    Cardiac Enzymes:  Recent Labs Lab 10/28/16 1517  TROPONINI <0.03   BNP: BNP  (last 3 results) No results for input(s): BNP in the last 8760 hours.  ProBNP (last 3 results) No results for input(s): PROBNP in the last 8760 hours.  CBG: No results for input(s): GLUCAP in the last 168 hours.     SignedLelon Frohlich  [facility] Pager: [phone] 10/29/2016, 4:27 PM

## 2016-10-29 NOTE — Care Management Note (Signed)
Case Management Note  Patient Details  Name: QUINCY BOY MRN: [id number] Date of Birth: 01/10/1956  Expected Discharge Date:  10/30/16               Expected Discharge Plan:  Home/Self Care  In-House Referral:  NA  Discharge planning Services  CM Consult  Post Acute Care Choice:  Durable Medical Equipment Choice offered to:  Patient  DME Arranged:  Kasandra Knudsen DME Agency:  [facility]  Status of Service:  Completed, signed off  Additional Comments: CM notified by MD pt will be discharging today. He was given the option to pick up cane from [place] and he chose to wait for it to be delivered. Pt is not interested in OT referral at this time. He communicates no further needs.   Sherald Barge, RN 10/29/2016, 2:57 PM

## 2016-10-29 NOTE — Progress Notes (Signed)
Discharge instructions read to patient and son and his sister. All verbalized understanding of instructions. Stroke warning signs and call to 911 was emphasized. Discharged to home with family.

## 2016-10-29 NOTE — Care Management (Signed)
CM received notice that pt has other DME needs. CM called into room and pt's sister feel he needs 3 in 1 and shower stool (3 in 1 will not fit into his shower). Pt's sister made aware insurance may not pay for shower stool. They prefer [facility] deliver DME as they will be the fastest. Romualdo Bolk, of [facility], made aware of referral and will obtain pt info from chart and deliver DME to room prior to DC.   Pt's cane has been delivered from [place] per sister.

## 2016-10-30 ENCOUNTER — Encounter: Payer: Self-pay | Admitting: Vascular Surgery

## 2016-10-30 ENCOUNTER — Ambulatory Visit (INDEPENDENT_AMBULATORY_CARE_PROVIDER_SITE_OTHER): Payer: BLUE CROSS/BLUE SHIELD | Admitting: Vascular Surgery

## 2016-10-30 ENCOUNTER — Other Ambulatory Visit: Payer: Self-pay

## 2016-10-30 VITALS — BP 175/120 | HR 68 | Temp 97.0°F | Resp 16 | Ht 71.0 in | Wt 160.5 lb

## 2016-10-30 DIAGNOSIS — I6523 Occlusion and stenosis of bilateral carotid arteries: Secondary | ICD-10-CM

## 2016-10-30 LAB — HIV ANTIBODY (ROUTINE TESTING W REFLEX): HIV SCREEN 4TH GENERATION: NONREACTIVE

## 2016-10-30 LAB — HEMOGLOBIN A1C
HEMOGLOBIN A1C: 5.8 % — AB (ref 4.8–5.6)
Mean Plasma Glucose: 120 mg/dL

## 2016-10-30 NOTE — Progress Notes (Signed)
Referring Physician: Dr Arnoldo [facility]  Patient name: Ethan Fields MRN: [id number] DOB: April 27, 1955 Sex: male  REASON FOR CONSULT: Symptomatic left internal carotid artery stenosis  HPI: Ethan Fields is a 61 y.o. male status post left brain stroke earlier this week. He was admitted [facility]. He had right arm and right leg weakness. MRI scan showed acute left frontal and parietal lobe infarcts. The patient did have a stroke in the remote past involving the right MCA. He has had no other symptoms recently. 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 He currently is on aspirin and Plavix. He is a former smoker but quit 8 years ago. He is currently on Lipitor aspirin and Plavix. He still has some residual right hand clumsiness and mild right leg weakness. He is currently walking with a cane. He denies any slurred speech but states that he did have some changes in his speech at the time of the acute event. This seems to have resolved.  Family history is remarkable for family members having strokes in their 39s and 79s in multiple family members.   Other medical problems include coronary artery disease, hyperlipidemia, hypertension all of which are been stable.  Past Medical History:  Diagnosis Date  . CAD (coronary artery disease)   . Hyperlipidemia   . Hypertension   . Myocardial infarct ([place]) 2010  . Stroke [facility]) 2011   TIA   No past surgical history on file.  No family history on file.   See history of present illness SOCIAL HISTORY: Social History   Social History  . Marital status: Single    Spouse name: N/A  . Number of children: N/A  . Years of education: N/A   Occupational History  . Not on file.   Social History Main Topics  . Smoking status: Former Smoker    Types: Cigarettes    Quit date: 2010  . Smokeless tobacco: Never Used  . Alcohol use Yes     Comment: occasional  . Drug use: Yes    Types: Marijuana  .  Sexual activity: Not on file   Other Topics Concern  . Not on file   Social History Narrative  . No narrative on file    No Known Allergies  Current Outpatient Prescriptions  Medication Sig Dispense Refill  . amLODipine (NORVASC) 5 MG tablet Take 5 mg by mouth daily.  6  . aspirin 325 MG tablet Take 1 tablet (325 mg total) by mouth daily.    [name] atorvastatin (LIPITOR) 80 MG tablet Take 1 tablet (80 mg total) by mouth daily at 6 PM. 30 tablet 2  . carvedilol (COREG) 6.25 MG tablet Take 6.25 mg by mouth 2 (two) times daily with a meal.    . clopidogrel (PLAVIX) 75 MG tablet Take 75 mg by mouth daily.    [name] lisinopril (PRINIVIL,ZESTRIL) 10 MG tablet Take 10 mg by mouth daily.     No current facility-administered medications for this visit.     ROS:   General:  No weight loss, Fever, chills  HEENT: No recent headaches, no nasal bleeding, no visual changes, no sore throat  Neurologic: No dizziness, blackouts, seizures. + recent symptoms of stroke or mini- stroke. + recent episodes of slurred speech, or temporary blindness.  Cardiac: No recent episodes of chest pain/pressure, no shortness of breath at rest.  No shortness of breath with exertion.  Denies history of atrial fibrillation or irregular heartbeat  Vascular: No history of rest pain in feet.  No history of claudication.  No history of non-healing ulcer, No history of DVT   Pulmonary: No home oxygen, no productive cough, no hemoptysis,  No asthma or wheezing  Musculoskeletal:  [ ]  Arthritis, [ ]  Low back pain,  [ ]  Joint pain  Hematologic:No history of hypercoagulable state.  No history of easy bleeding.  No history of anemia  Gastrointestinal: No hematochezia or melena,  No gastroesophageal reflux, no trouble swallowing  Urinary: [ ]  chronic Kidney disease, [ ]  on HD - [ ]  MWF or [ ]  TTHS, [ ]  Burning with urination, [ ]  Frequent urination, [ ]  Difficulty urinating;   Skin: No rashes  Psychological: No history of  anxiety,  No history of depression   Physical Examination  Vitals:   10/30/16 0906 10/30/16 0913  BP: (!) 160/119 (!) 175/120  Pulse: 68   Resp: 16   Temp: (!) 97 F (36.1 C)   TempSrc: Oral   SpO2: 98%   Weight: 160 lb 8 oz (72.8 kg)   Height: 5\' 11"  (1.803 m)     Body mass index is 22.39 kg/m.  General:  Alert and oriented, no acute distress HEENT: Normal Neck: No bruit or JVD Pulmonary: Clear to auscultation bilaterally Cardiac: Regular Rate and Rhythm without murmur Abdomen: Soft, non-tender, non-distended, no mass Skin: No rash Extremity Pulses:  2+ radial, brachial, femoral,posterior tibial pulses bilaterally Musculoskeletal: No deformity or edema  Neurologic: Upper and lower extremity motor 5/5 and symmetric With mild clumsiness of the right hand and slight gait and balance instability when ambulating  DATA:  I reviewed the patient's carotid duplex exam from [facility] dated 10/29/2016. This showed antegrade left and right vertebral flow with a less than 50% right internal carotid artery stenosis and greater than 70% left internal carotid artery stenosis. I also reviewed the images from the patient's MRI and his MRA of the head.   ASSESSMENT:  Symptomatic left internal carotid artery stenosis   PLAN:  Left carotid endarterectomy. I discussed the patient with my partner Dr. Donnetta Hutching be available next week earlier than myself to do the patient's carotid endarterectomy. Patient also spoke with Dr. Donnetta Hutching today. We have arranged his left carotid endarterectomy for next week. Risk benefits possible complications and procedure details including not limited to bleeding infection stroke risk of 1-2% cranial nerve injury were all discussed with patient and his sister today. He understands and agrees to proceed.   Ruta Hinds, MD [facility] [place] Office: [phone] Pager: [phone]

## 2016-11-04 ENCOUNTER — Encounter (HOSPITAL_COMMUNITY): Payer: Self-pay | Admitting: *Deleted

## 2016-11-05 ENCOUNTER — Telehealth: Payer: Self-pay | Admitting: Vascular Surgery

## 2016-11-05 ENCOUNTER — Inpatient Hospital Stay (HOSPITAL_COMMUNITY): Payer: BLUE CROSS/BLUE SHIELD | Admitting: Anesthesiology

## 2016-11-05 ENCOUNTER — Inpatient Hospital Stay (HOSPITAL_COMMUNITY)
Admission: RE | Admit: 2016-11-05 | Discharge: 2016-11-06 | DRG: 038 | Disposition: A | Discharge status: Home / Self Care | Payer: BLUE CROSS/BLUE SHIELD | Source: Ambulatory Visit | Attending: Vascular Surgery | Admitting: Vascular Surgery

## 2016-11-05 ENCOUNTER — Encounter (HOSPITAL_COMMUNITY): Payer: Self-pay | Admitting: Surgery

## 2016-11-05 ENCOUNTER — Encounter (HOSPITAL_COMMUNITY)
Admission: RE | Disposition: A | Discharge status: Home / Self Care | Payer: Self-pay | Source: Ambulatory Visit | Attending: Vascular Surgery

## 2016-11-05 DIAGNOSIS — Z87891 Personal history of nicotine dependence: Secondary | ICD-10-CM | POA: Diagnosis not present

## 2016-11-05 DIAGNOSIS — I6522 Occlusion and stenosis of left carotid artery: Secondary | ICD-10-CM | POA: Diagnosis present

## 2016-11-05 DIAGNOSIS — I252 Old myocardial infarction: Secondary | ICD-10-CM | POA: Diagnosis not present

## 2016-11-05 DIAGNOSIS — Z7982 Long term (current) use of aspirin: Secondary | ICD-10-CM

## 2016-11-05 DIAGNOSIS — E785 Hyperlipidemia, unspecified: Secondary | ICD-10-CM | POA: Diagnosis present

## 2016-11-05 DIAGNOSIS — I69351 Hemiplegia and hemiparesis following cerebral infarction affecting right dominant side: Secondary | ICD-10-CM

## 2016-11-05 DIAGNOSIS — Z823 Family history of stroke: Secondary | ICD-10-CM

## 2016-11-05 DIAGNOSIS — Z7902 Long term (current) use of antithrombotics/antiplatelets: Secondary | ICD-10-CM | POA: Diagnosis not present

## 2016-11-05 DIAGNOSIS — Z79899 Other long term (current) drug therapy: Secondary | ICD-10-CM | POA: Diagnosis not present

## 2016-11-05 DIAGNOSIS — I10 Essential (primary) hypertension: Secondary | ICD-10-CM | POA: Diagnosis present

## 2016-11-05 DIAGNOSIS — I2510 Atherosclerotic heart disease of native coronary artery without angina pectoris: Secondary | ICD-10-CM | POA: Diagnosis present

## 2016-11-05 HISTORY — PX: PATCH ANGIOPLASTY: SHX6230

## 2016-11-05 HISTORY — PX: ENDARTERECTOMY: SHX5162

## 2016-11-05 HISTORY — PX: CAROTID ENDARTERECTOMY: SUR193

## 2016-11-05 LAB — CBC
HEMATOCRIT: 38.8 % — AB (ref 39.0–52.0)
HEMOGLOBIN: 13.4 g/dL (ref 13.0–17.0)
MCH: 30.8 pg (ref 26.0–34.0)
MCHC: 34.5 g/dL (ref 30.0–36.0)
MCV: 89.2 fL (ref 78.0–100.0)
Platelets: 228 10*3/uL (ref 150–400)
RBC: 4.35 MIL/uL (ref 4.22–5.81)
RDW: 16.4 % — ABNORMAL HIGH (ref 11.5–15.5)
WBC: 8.1 10*3/uL (ref 4.0–10.5)

## 2016-11-05 LAB — PROTIME-INR
INR: 1.02
PROTHROMBIN TIME: 13.4 s (ref 11.4–15.2)

## 2016-11-05 LAB — ABO/RH: ABO/RH(D): B POS

## 2016-11-05 LAB — CREATININE, SERUM: Creatinine, Ser: 1.05 mg/dL (ref 0.61–1.24)

## 2016-11-05 LAB — TYPE AND SCREEN
ABO/RH(D): B POS
ANTIBODY SCREEN: NEGATIVE

## 2016-11-05 LAB — APTT: APTT: 32 s (ref 24–36)

## 2016-11-05 SURGERY — ENDARTERECTOMY, CAROTID
Anesthesia: General | Site: Neck | Laterality: Left

## 2016-11-05 MED ORDER — ACETAMINOPHEN 650 MG RE SUPP
325.0000 mg | RECTAL | Status: DC | PRN
Start: 2016-11-05 — End: 2016-11-06

## 2016-11-05 MED ORDER — GLYCOPYRROLATE 0.2 MG/ML IJ SOLN
INTRAMUSCULAR | Status: DC | PRN
  Administered 2016-11-05: 0.2 mg via INTRAVENOUS

## 2016-11-05 MED ORDER — ONDANSETRON HCL 4 MG/2ML IJ SOLN: 4.0000 mg | Freq: Four times a day (QID) | INTRAMUSCULAR | Status: DC | PRN

## 2016-11-05 MED ORDER — SODIUM CHLORIDE 0.9 % IV SOLN
INTRAVENOUS | Status: DC
  Administered 2016-11-05 – 2016-11-06 (×2): via INTRAVENOUS

## 2016-11-05 MED ORDER — DOCUSATE SODIUM 100 MG PO CAPS
100.0000 mg | ORAL_CAPSULE | Freq: Every day | ORAL | Status: DC
  Administered 2016-11-06: 100 mg via ORAL
  Filled 2016-11-05: qty 1

## 2016-11-05 MED ORDER — FENTANYL CITRATE (PF) 250 MCG/5ML IJ SOLN
INTRAMUSCULAR | Status: AC
  Filled 2016-11-05: qty 5

## 2016-11-05 MED ORDER — MAGNESIUM SULFATE 2 GM/50ML IV SOLN: 2.0000 g | Freq: Every day | INTRAVENOUS | Status: DC | PRN

## 2016-11-05 MED ORDER — PROPOFOL 10 MG/ML IV BOLUS
INTRAVENOUS | Status: DC | PRN
  Administered 2016-11-05: 150 mg via INTRAVENOUS

## 2016-11-05 MED ORDER — OXYCODONE-ACETAMINOPHEN 5-325 MG PO TABS: 1.0000 | ORAL_TABLET | Freq: Four times a day (QID) | ORAL | 0 refills | Status: DC | PRN

## 2016-11-05 MED ORDER — PROPOFOL 10 MG/ML IV BOLUS
INTRAVENOUS | Status: AC
Start: 2016-11-05 — End: 2016-11-05
  Filled 2016-11-05: qty 40

## 2016-11-05 MED ORDER — MIDAZOLAM HCL 5 MG/5ML IJ SOLN
INTRAMUSCULAR | Status: DC | PRN
  Administered 2016-11-05: 1 mg via INTRAVENOUS

## 2016-11-05 MED ORDER — SUGAMMADEX SODIUM 200 MG/2ML IV SOLN
INTRAVENOUS | Status: DC | PRN
  Administered 2016-11-05: 145.6 mg via INTRAVENOUS

## 2016-11-05 MED ORDER — LISINOPRIL 20 MG PO TABS
30.0000 mg | ORAL_TABLET | Freq: Every day | ORAL | Status: DC
  Administered 2016-11-05 – 2016-11-06 (×2): 30 mg via ORAL
  Filled 2016-11-05 (×2): qty 1

## 2016-11-05 MED ORDER — ACETAMINOPHEN 325 MG PO TABS: 325.0000 mg | ORAL_TABLET | ORAL | Status: DC | PRN

## 2016-11-05 MED ORDER — MORPHINE SULFATE (PF) 2 MG/ML IV SOLN: 2.0000 mg | INTRAVENOUS | Status: DC | PRN

## 2016-11-05 MED ORDER — PROTAMINE SULFATE 10 MG/ML IV SOLN
INTRAVENOUS | Status: DC | PRN
  Administered 2016-11-05: 10 mg via INTRAVENOUS
  Administered 2016-11-05: 25 mg via INTRAVENOUS
  Administered 2016-11-05 (×3): 10 mg via INTRAVENOUS
  Administered 2016-11-05: 25 mg via INTRAVENOUS
  Administered 2016-11-05: 10 mg via INTRAVENOUS

## 2016-11-05 MED ORDER — DEXTROSE 5 % IV SOLN
INTRAVENOUS | Status: AC
  Filled 2016-11-05: qty 1.5

## 2016-11-05 MED ORDER — ROCURONIUM BROMIDE 100 MG/10ML IV SOLN
INTRAVENOUS | Status: DC | PRN
  Administered 2016-11-05: 10 mg via INTRAVENOUS
  Administered 2016-11-05: 50 mg via INTRAVENOUS

## 2016-11-05 MED ORDER — PANTOPRAZOLE SODIUM 40 MG PO TBEC
40.0000 mg | DELAYED_RELEASE_TABLET | Freq: Every day | ORAL | Status: DC
  Administered 2016-11-06: 40 mg via ORAL
  Filled 2016-11-05: qty 1

## 2016-11-05 MED ORDER — ROCURONIUM BROMIDE 50 MG/5ML IV SOLN
INTRAVENOUS | Status: AC
  Filled 2016-11-05: qty 2

## 2016-11-05 MED ORDER — PHENYLEPHRINE HCL 10 MG/ML IJ SOLN
INTRAVENOUS | Status: DC | PRN
  Administered 2016-11-05: 50 ug/min via INTRAVENOUS

## 2016-11-05 MED ORDER — POTASSIUM CHLORIDE CRYS ER 20 MEQ PO TBCR: 20.0000 meq | EXTENDED_RELEASE_TABLET | Freq: Every day | ORAL | Status: DC | PRN

## 2016-11-05 MED ORDER — HEPARIN SODIUM (PORCINE) 1000 UNIT/ML IJ SOLN
INTRAMUSCULAR | Status: DC | PRN
  Administered 2016-11-05: 7000 [IU] via INTRAVENOUS

## 2016-11-05 MED ORDER — SUGAMMADEX SODIUM 200 MG/2ML IV SOLN
INTRAVENOUS | Status: AC
  Filled 2016-11-05: qty 2

## 2016-11-05 MED ORDER — OXYCODONE HCL 5 MG/5ML PO SOLN: 5.0000 mg | Freq: Once | ORAL | Status: DC | PRN

## 2016-11-05 MED ORDER — FENTANYL CITRATE (PF) 100 MCG/2ML IJ SOLN
INTRAMUSCULAR | Status: DC | PRN
  Administered 2016-11-05: 100 ug via INTRAVENOUS
  Administered 2016-11-05 (×2): 50 ug via INTRAVENOUS
  Administered 2016-11-05: 100 ug via INTRAVENOUS

## 2016-11-05 MED ORDER — ALUM & MAG HYDROXIDE-SIMETH 200-200-20 MG/5ML PO SUSP: 15.0000 mL | ORAL | Status: DC | PRN

## 2016-11-05 MED ORDER — GUAIFENESIN-DM 100-10 MG/5ML PO SYRP: 15.0000 mL | ORAL_SOLUTION | ORAL | Status: DC | PRN

## 2016-11-05 MED ORDER — PHENYLEPHRINE 40 MCG/ML (10ML) SYRINGE FOR IV PUSH (FOR BLOOD PRESSURE SUPPORT)
PREFILLED_SYRINGE | INTRAVENOUS | Status: AC
  Filled 2016-11-05: qty 10

## 2016-11-05 MED ORDER — ENOXAPARIN SODIUM 40 MG/0.4ML ~~LOC~~ SOLN: 40.0000 mg | SUBCUTANEOUS | Status: DC

## 2016-11-05 MED ORDER — OXYCODONE HCL 5 MG PO TABS: 5.0000 mg | ORAL_TABLET | Freq: Once | ORAL | Status: DC | PRN

## 2016-11-05 MED ORDER — CHLORHEXIDINE GLUCONATE 4 % EX LIQD: 60.0000 mL | Freq: Once | CUTANEOUS | Status: DC

## 2016-11-05 MED ORDER — METOPROLOL TARTRATE 5 MG/5ML IV SOLN: 2.0000 mg | INTRAVENOUS | Status: DC | PRN

## 2016-11-05 MED ORDER — LABETALOL HCL 5 MG/ML IV SOLN
10.0000 mg | INTRAVENOUS | Status: DC | PRN
  Filled 2016-11-05: qty 4

## 2016-11-05 MED ORDER — SODIUM CHLORIDE 0.9 % IV SOLN: INTRAVENOUS | Status: DC

## 2016-11-05 MED ORDER — LIDOCAINE HCL (PF) 1 % IJ SOLN
INTRAMUSCULAR | Status: AC
Start: 2016-11-05 — End: 2016-11-05
  Filled 2016-11-05: qty 30

## 2016-11-05 MED ORDER — CARVEDILOL 6.25 MG PO TABS
6.2500 mg | ORAL_TABLET | Freq: Two times a day (BID) | ORAL | Status: DC
  Administered 2016-11-06: 6.25 mg via ORAL
  Filled 2016-11-05: qty 1

## 2016-11-05 MED ORDER — SODIUM CHLORIDE 0.9 % IV SOLN: 500.0000 mL | Freq: Once | INTRAVENOUS | Status: DC | PRN

## 2016-11-05 MED ORDER — OXYCODONE-ACETAMINOPHEN 5-325 MG PO TABS
1.0000 | ORAL_TABLET | ORAL | Status: DC | PRN
  Administered 2016-11-05: 2 via ORAL
  Filled 2016-11-05: qty 2

## 2016-11-05 MED ORDER — BISACODYL 10 MG RE SUPP: 10.0000 mg | Freq: Every day | RECTAL | Status: DC | PRN

## 2016-11-05 MED ORDER — AMLODIPINE BESYLATE 5 MG PO TABS
5.0000 mg | ORAL_TABLET | Freq: Every day | ORAL | Status: DC
  Administered 2016-11-06: 5 mg via ORAL
  Filled 2016-11-05: qty 1

## 2016-11-05 MED ORDER — LIDOCAINE HCL (CARDIAC) 20 MG/ML IV SOLN
INTRAVENOUS | Status: DC | PRN
  Administered 2016-11-05: 30 mg via INTRAVENOUS

## 2016-11-05 MED ORDER — LIDOCAINE HCL (CARDIAC) 20 MG/ML IV SOLN
INTRAVENOUS | Status: AC
  Filled 2016-11-05: qty 5

## 2016-11-05 MED ORDER — DEXTROSE 5 % IV SOLN
1.5000 g | Freq: Two times a day (BID) | INTRAVENOUS | Status: AC
  Administered 2016-11-06 (×2): 1.5 g via INTRAVENOUS
  Filled 2016-11-05 (×3): qty 1.5

## 2016-11-05 MED ORDER — PHENOL 1.4 % MT LIQD: 1.0000 | OROMUCOSAL | Status: DC | PRN

## 2016-11-05 MED ORDER — MIDAZOLAM HCL 2 MG/2ML IJ SOLN
INTRAMUSCULAR | Status: AC
  Filled 2016-11-05: qty 2

## 2016-11-05 MED ORDER — ONDANSETRON HCL 4 MG/2ML IJ SOLN
INTRAMUSCULAR | Status: AC
  Filled 2016-11-05: qty 2

## 2016-11-05 MED ORDER — FENTANYL CITRATE (PF) 100 MCG/2ML IJ SOLN: 25.0000 ug | INTRAMUSCULAR | Status: DC | PRN

## 2016-11-05 MED ORDER — ONDANSETRON HCL 4 MG/2ML IJ SOLN: 4.0000 mg | Freq: Once | INTRAMUSCULAR | Status: DC | PRN

## 2016-11-05 MED ORDER — ASPIRIN 325 MG PO TABS
325.0000 mg | ORAL_TABLET | Freq: Every day | ORAL | Status: DC
  Administered 2016-11-06: 325 mg via ORAL
  Filled 2016-11-05: qty 1

## 2016-11-05 MED ORDER — 0.9 % SODIUM CHLORIDE (POUR BTL) OPTIME
TOPICAL | Status: DC | PRN
  Administered 2016-11-05: 1000 mL

## 2016-11-05 MED ORDER — HYDRALAZINE HCL 20 MG/ML IJ SOLN
5.0000 mg | INTRAMUSCULAR | Status: AC | PRN
  Administered 2016-11-05 (×2): 5 mg via INTRAVENOUS
  Filled 2016-11-05 (×2): qty 1

## 2016-11-05 MED ORDER — ATORVASTATIN CALCIUM 80 MG PO TABS
80.0000 mg | ORAL_TABLET | Freq: Every day | ORAL | Status: DC
  Administered 2016-11-05: 80 mg via ORAL
  Filled 2016-11-05: qty 1

## 2016-11-05 MED ORDER — LACTATED RINGERS IV SOLN
INTRAVENOUS | Status: DC
  Administered 2016-11-05: 08:00:00 via INTRAVENOUS

## 2016-11-05 MED ORDER — CLOPIDOGREL BISULFATE 75 MG PO TABS
75.0000 mg | ORAL_TABLET | Freq: Every day | ORAL | Status: DC
  Administered 2016-11-06: 75 mg via ORAL
  Filled 2016-11-05: qty 1

## 2016-11-05 MED ORDER — DEXTROSE 5 % IV SOLN
1.5000 g | INTRAVENOUS | Status: AC
  Administered 2016-11-05: 1.5 g via INTRAVENOUS

## 2016-11-05 MED ORDER — LACTATED RINGERS IV SOLN
INTRAVENOUS | Status: DC | PRN
  Administered 2016-11-05 (×2): via INTRAVENOUS

## 2016-11-05 MED ORDER — HEPARIN SODIUM (PORCINE) 5000 UNIT/ML IJ SOLN
INTRAMUSCULAR | Status: DC | PRN
  Administered 2016-11-05: 500 mL

## 2016-11-05 SURGICAL SUPPLY — 40 items
CANISTER SUCT 3000ML PPV (MISCELLANEOUS) ×3 IMPLANT
CANNULA VESSEL 3MM 2 BLNT TIP (CANNULA) ×6 IMPLANT
CATH ROBINSON RED A/P 18FR (CATHETERS) ×3 IMPLANT
CLIP LIGATING EXTRA MED SLVR (CLIP) ×3 IMPLANT
CLIP LIGATING EXTRA SM BLUE (MISCELLANEOUS) ×3 IMPLANT
CRADLE DONUT ADULT HEAD (MISCELLANEOUS) ×3 IMPLANT
DECANTER SPIKE VIAL GLASS SM (MISCELLANEOUS) IMPLANT
DERMABOND ADVANCED (GAUZE/BANDAGES/DRESSINGS) ×2
DERMABOND ADVANCED .7 DNX12 (GAUZE/BANDAGES/DRESSINGS) ×1 IMPLANT
DRAIN HEMOVAC 1/8 X 5 (WOUND CARE) IMPLANT
ELECT REM PT RETURN 9FT ADLT (ELECTROSURGICAL) ×3
ELECTRODE REM PT RTRN 9FT ADLT (ELECTROSURGICAL) ×1 IMPLANT
EVACUATOR SILICONE 100CC (DRAIN) IMPLANT
GLOVE BIO SURGEON STRL SZ 6.5 (GLOVE) ×2 IMPLANT
GLOVE BIO SURGEONS STRL SZ 6.5 (GLOVE) ×1
GLOVE BIOGEL PI IND STRL 6.5 (GLOVE) ×1 IMPLANT
GLOVE BIOGEL PI INDICATOR 6.5 (GLOVE) ×2
GLOVE SS BIOGEL STRL SZ 7.5 (GLOVE) ×1 IMPLANT
GLOVE SUPERSENSE BIOGEL SZ 7.5 (GLOVE) ×2
GOWN STRL REUS W/ TWL LRG LVL3 (GOWN DISPOSABLE) ×4 IMPLANT
GOWN STRL REUS W/TWL LRG LVL3 (GOWN DISPOSABLE) ×8
KIT BASIN OR (CUSTOM PROCEDURE TRAY) ×3 IMPLANT
KIT ROOM TURNOVER OR (KITS) ×3 IMPLANT
KIT SHUNT ARGYLE CAROTID ART 6 (VASCULAR PRODUCTS) IMPLANT
NEEDLE 22X1 1/2 (OR ONLY) (NEEDLE) IMPLANT
NS IRRIG 1000ML POUR BTL (IV SOLUTION) ×6 IMPLANT
PACK CAROTID (CUSTOM PROCEDURE TRAY) ×3 IMPLANT
PAD ARMBOARD 7.5X6 YLW CONV (MISCELLANEOUS) ×6 IMPLANT
PATCH HEMASHIELD 8X75 (Vascular Products) ×3 IMPLANT
SHUNT CAROTID BYPASS 10 (VASCULAR PRODUCTS) ×3 IMPLANT
SHUNT CAROTID BYPASS 12FRX15.5 (VASCULAR PRODUCTS) IMPLANT
SUT ETHILON 3 0 PS 1 (SUTURE) IMPLANT
SUT PROLENE 6 0 CC (SUTURE) ×15 IMPLANT
SUT SILK 3 0 (SUTURE)
SUT SILK 3-0 18XBRD TIE 12 (SUTURE) IMPLANT
SUT VIC AB 3-0 SH 27 (SUTURE) ×4
SUT VIC AB 3-0 SH 27X BRD (SUTURE) ×2 IMPLANT
SUT VICRYL 4-0 PS2 18IN ABS (SUTURE) ×3 IMPLANT
SYR CONTROL 10ML LL (SYRINGE) IMPLANT
WATER STERILE IRR 1000ML POUR (IV SOLUTION) ×3 IMPLANT

## 2016-11-05 NOTE — Telephone Encounter (Signed)
-----   Message from Mena Goes, RN sent at 11/05/2016  3:26 PM EDT ----- Regarding: 2 weeks   ----- Message ----- From: Alvia Grove, PA-C Sent: 11/05/2016   2:35 PM To: Vvs Charge Pool  S/p left carotid endarterectomy 11/05/16  F/u with Dr. Donnetta Hutching in 2 weeks  Thanks Maudie Mercury

## 2016-11-05 NOTE — Anesthesia Procedure Notes (Signed)
Procedure Name: Intubation Date/Time: 11/05/2016 11:17 AM Performed by: Eligha Bridegroom Pre-anesthesia Checklist: Patient identified, Emergency Drugs available, Suction available, Patient being monitored and Timeout performed Patient Re-evaluated:Patient Re-evaluated prior to induction Oxygen Delivery Method: Circle system utilized Preoxygenation: Pre-oxygenation with 100% oxygen Induction Type: IV induction Ventilation: Mask ventilation without difficulty Laryngoscope Size: Mac and 4 Grade View: Grade I Tube type: Oral Tube size: 8.0 mm Number of attempts: 1 Airway Equipment and Method: Stylet Placement Confirmation: ETT inserted through vocal cords under direct vision,  positive ETCO2 and breath sounds checked- equal and bilateral Secured at: 21 cm Tube secured with: Tape Dental Injury: Teeth and Oropharynx as per pre-operative assessment

## 2016-11-05 NOTE — H&P (View-Only) (Signed)
Referring Physician: Dr Arnoldo [facility]  Patient name: Ethan Fields MRN: [id number] DOB: 05-07-1955 Sex: male  REASON FOR CONSULT: Symptomatic left internal carotid artery stenosis  HPI: Ethan Fields is a 61 y.o. male status post left brain stroke earlier this week. He was admitted [facility]. He had right arm and right leg weakness. MRI scan showed acute left frontal and parietal lobe infarcts. The patient did have a stroke in the remote past involving the right MCA. He has had no other symptoms recently. 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 He currently is on aspirin and Plavix. He is a former smoker but quit 8 years ago. He is currently on Lipitor aspirin and Plavix. He still has some residual right hand clumsiness and mild right leg weakness. He is currently walking with a cane. He denies any slurred speech but states that he did have some changes in his speech at the time of the acute event. This seems to have resolved.  Family history is remarkable for family members having strokes in their 81s and 25s in multiple family members.   Other medical problems include coronary artery disease, hyperlipidemia, hypertension all of which are been stable.  Past Medical History:  Diagnosis Date  . CAD (coronary artery disease)   . Hyperlipidemia   . Hypertension   . Myocardial infarct ([place]) 2010  . Stroke [facility]) 2011   TIA   No past surgical history on file.  No family history on file.   See history of present illness SOCIAL HISTORY: Social History   Social History  . Marital status: Single    Spouse name: N/A  . Number of children: N/A  . Years of education: N/A   Occupational History  . Not on file.   Social History Main Topics  . Smoking status: Former Smoker    Types: Cigarettes    Quit date: 2010  . Smokeless tobacco: Never Used  . Alcohol use Yes     Comment: occasional  . Drug use: Yes    Types: Marijuana  .  Sexual activity: Not on file   Other Topics Concern  . Not on file   Social History Narrative  . No narrative on file    No Known Allergies  Current Outpatient Prescriptions  Medication Sig Dispense Refill  . amLODipine (NORVASC) 5 MG tablet Take 5 mg by mouth daily.  6  . aspirin 325 MG tablet Take 1 tablet (325 mg total) by mouth daily.    [name] atorvastatin (LIPITOR) 80 MG tablet Take 1 tablet (80 mg total) by mouth daily at 6 PM. 30 tablet 2  . carvedilol (COREG) 6.25 MG tablet Take 6.25 mg by mouth 2 (two) times daily with a meal.    . clopidogrel (PLAVIX) 75 MG tablet Take 75 mg by mouth daily.    [name] lisinopril (PRINIVIL,ZESTRIL) 10 MG tablet Take 10 mg by mouth daily.     No current facility-administered medications for this visit.     ROS:   General:  No weight loss, Fever, chills  HEENT: No recent headaches, no nasal bleeding, no visual changes, no sore throat  Neurologic: No dizziness, blackouts, seizures. + recent symptoms of stroke or mini- stroke. + recent episodes of slurred speech, or temporary blindness.  Cardiac: No recent episodes of chest pain/pressure, no shortness of breath at rest.  No shortness of breath with exertion.  Denies history of atrial fibrillation or irregular heartbeat  Vascular: No history of rest pain in feet.  No history of claudication.  No history of non-healing ulcer, No history of DVT   Pulmonary: No home oxygen, no productive cough, no hemoptysis,  No asthma or wheezing  Musculoskeletal:  [ ]  Arthritis, [ ]  Low back pain,  [ ]  Joint pain  Hematologic:No history of hypercoagulable state.  No history of easy bleeding.  No history of anemia  Gastrointestinal: No hematochezia or melena,  No gastroesophageal reflux, no trouble swallowing  Urinary: [ ]  chronic Kidney disease, [ ]  on HD - [ ]  MWF or [ ]  TTHS, [ ]  Burning with urination, [ ]  Frequent urination, [ ]  Difficulty urinating;   Skin: No rashes  Psychological: No history of  anxiety,  No history of depression   Physical Examination  Vitals:   10/30/16 0906 10/30/16 0913  BP: (!) 160/119 (!) 175/120  Pulse: 68   Resp: 16   Temp: (!) 97 F (36.1 C)   TempSrc: Oral   SpO2: 98%   Weight: 160 lb 8 oz (72.8 kg)   Height: 5\' 11"  (1.803 m)     Body mass index is 22.39 kg/m.  General:  Alert and oriented, no acute distress HEENT: Normal Neck: No bruit or JVD Pulmonary: Clear to auscultation bilaterally Cardiac: Regular Rate and Rhythm without murmur Abdomen: Soft, non-tender, non-distended, no mass Skin: No rash Extremity Pulses:  2+ radial, brachial, femoral,posterior tibial pulses bilaterally Musculoskeletal: No deformity or edema  Neurologic: Upper and lower extremity motor 5/5 and symmetric With mild clumsiness of the right hand and slight gait and balance instability when ambulating  DATA:  I reviewed the patient's carotid duplex exam from [facility] dated 10/29/2016. This showed antegrade left and right vertebral flow with a less than 50% right internal carotid artery stenosis and greater than 70% left internal carotid artery stenosis. I also reviewed the images from the patient's MRI and his MRA of the head.   ASSESSMENT:  Symptomatic left internal carotid artery stenosis   PLAN:  Left carotid endarterectomy. I discussed the patient with my partner Dr. Donnetta Hutching be available next week earlier than myself to do the patient's carotid endarterectomy. Patient also spoke with Dr. Donnetta Hutching today. We have arranged his left carotid endarterectomy for next week. Risk benefits possible complications and procedure details including not limited to bleeding infection stroke risk of 1-2% cranial nerve injury were all discussed with patient and his sister today. He understands and agrees to proceed.   Ruta Hinds, MD [facility] [place] Office: [phone] Pager: [phone]

## 2016-11-05 NOTE — Telephone Encounter (Signed)
Sched appt 11/26/16 at 8:30. lm on hm#.

## 2016-11-05 NOTE — Anesthesia Preprocedure Evaluation (Signed)
Anesthesia Evaluation  Patient identified by MRN, date of birth, ID band Patient awake    Reviewed: Allergy & Precautions, NPO status , Patient's Chart, lab work & pertinent test results  Airway Mallampati: II  TM Distance: >3 FB Neck ROM: Full    Dental  (+) Edentulous Upper, Edentulous Lower   Pulmonary former smoker,    breath sounds clear to auscultation       Cardiovascular hypertension,  Rhythm:Regular Rate:Normal     Neuro/Psych    GI/Hepatic   Endo/Other    Renal/GU      Musculoskeletal   Abdominal   Peds  Hematology   Anesthesia Other Findings   Reproductive/Obstetrics                             Anesthesia Physical Anesthesia Plan  ASA: III  Anesthesia Plan: General   Post-op Pain Management:    Induction: Intravenous  PONV Risk Score and Plan: 1 and Ondansetron and Dexamethasone  Airway Management Planned: Oral ETT  Additional Equipment: Arterial line  Intra-op Plan:   Post-operative Plan: Extubation in OR  Informed Consent: I have reviewed the patients History and Physical, chart, labs and discussed the procedure including the risks, benefits and alternatives for the proposed anesthesia with the patient or authorized representative who has indicated his/her understanding and acceptance.     Plan Discussed with: CRNA and Anesthesiologist  Anesthesia Plan Comments:         Anesthesia Quick Evaluation

## 2016-11-05 NOTE — Progress Notes (Signed)
  [facility] Day [facility] Note  Subjective:  Patient seen in PACU. No complaints.  Vitals:   11/05/16 1356 11/05/16 1410  BP: (!) 156/88 113/65  Pulse: 66 60  Resp: (!) 24 19  Temp: (!) 97.4 F (36.3 C)    Left neck without hematoma Smile symmetric. Tongue midline. 5/5 strength upper and lower extremities bilaterally.  Assessment/Plan:  This is a 61 y.o. male who is s/p left carotid endarterectomy  Stable post-op. Neuro intact. To 4E when bed available.   Virgina Jock, [place] Pager: [phone] 11/05/2016 2:33 PM

## 2016-11-05 NOTE — Transfer of Care (Signed)
Immediate Anesthesia Transfer of Care Note  Patient: Ethan Fields  Procedure(s) Performed: Procedure(s): ENDARTERECTOMY CAROTID-LEFT (Left) PATCH ANGIOPLASTY (Left)  Patient Location: PACU  Anesthesia Type:General  Level of Consciousness: drowsy and patient cooperative  Airway & Oxygen Therapy: Patient Spontanous Breathing  Post-op Assessment: Report given to RN and Post -op Vital signs reviewed and stable  Post vital signs: Reviewed and stable  Last Vitals:  Vitals:   11/05/16 0803 11/05/16 1356  BP: (!) 174/100 (!) 156/88  Pulse:  66  Resp:  (!) 24  Temp:  (!) 36.3 C    Last Pain:  Vitals:   11/05/16 1356  TempSrc:   PainSc: 0-No pain      Patients Stated Pain Goal: 3 (29/56/21 3086)  Complications: No apparent anesthesia complications

## 2016-11-05 NOTE — Anesthesia Procedure Notes (Signed)
Arterial Line Insertion Start/End7/18/2018 9:30 AM Performed by: Eligha Bridegroom, CRNA  Patient location: OOR procedure area. Preanesthetic checklist: patient identified, IV checked, site marked, risks and benefits discussed, surgical consent, monitors and equipment checked, pre-op evaluation and timeout performed Right, radial was placed Catheter size: 20 G Hand hygiene performed  and maximum sterile barriers used   Attempts: 1 Procedure performed without using ultrasound guided technique. Following insertion, dressing applied and Biopatch. Post procedure assessment: normal  Patient tolerated the procedure well with no immediate complications.

## 2016-11-05 NOTE — Interval H&P Note (Signed)
History and Physical Interval Note:  11/05/2016 8:43 AM  Ethan Fields  has presented today for surgery, with the diagnosis of Left Internal Carotid Artery Stenosis  I65.22  The various methods of treatment have been discussed with the patient and family. After consideration of risks, benefits and other options for treatment, the patient has consented to  Procedure(s): ENDARTERECTOMY CAROTID-LEFT (Left) as a surgical intervention .  The patient's history has been reviewed, patient examined, no change in status, stable for surgery.  I have reviewed the patient's chart and labs.  Questions were answered to the patient's satisfaction.     Curt Jews

## 2016-11-05 NOTE — Op Note (Signed)
    OPERATIVE REPORT  DATE OF SURGERY: 11/05/2016  PATIENT: Ethan Fields, 61 y.o. male MRN: [id number]  DOB: Oct 11, 1955  PRE-OPERATIVE DIAGNOSIS: Symptomatic left carotid artery stenosis  POST-OPERATIVE DIAGNOSIS:  Same  PROCEDURE: Left carotid endarterectomy and Dacron patch angioplasty  SURGEON:  Curt Jews, M.D.  PHYSICIAN ASSISTANT: Pervis Hocking PA-C  ANESTHESIA:  Gen.  EBL: 100 ml  Total I/O In: 1000 [I.V.:1000] Out: 100 [Blood:100]  BLOOD ADMINISTERED: None  DRAINS: None  SPECIMEN: None  COUNTS CORRECT:  YES  PLAN OF CARE: PACU neurologically at his baseline   PATIENT DISPOSITION:  PACU - hemodynamically stable  PROCEDURE DETAILS: Patient was taken up and placed supine position the area of the left [name] sterile fashion. Incision was made anterior to the sternocleidomastoid and carried down through the platysma with electrocautery. The sternocleidomastoid was reflected posteriorly and the carotid sheath was opened. The facial vein was ligated with 2-0 silk ties and divided. The vagus and hypoglossal nerves were defined preserved. The common carotid artery was encircled with an umbilical tape and Rummel tourniquet. The superior thyroid artery was encircled with a 2 silk Potts tie. The external carotid was encircled with a blue vessel loop and the internal carotid was encircled with a umbilical tape and Rummel tourniquet. The patient was given 7000 units intravenous heparin and after adequate circulation time the internal/external and common carotid arteries were occluded. The common carotid artery was opened with 11 blade and several shooting with Potts scissors onto the internal carotid artery. There was extensive irregular plaque at the bifurcation. The 10 shunt was passed up the internal carotid, was allowed to back bleed and down the common carotid were secured with Rummel tourniquet. Endarterectomy skin on the common carotid artery and the plaque was divided proximally  with Potts scissors. The endarterectomy was carried onto the bifurcation. The external carotid was endarterectomized with an eversion technique and the internal carotid was endarterectomized in an open fashion. Remaining atheromatous debris was removed from the endarterectomy plane. A Finesse Hemashield Dacron patch was brought onto the field and was spatulated to plasty with a running 6-0 Prolene suture. Prior to completion of the closure the shunt was removed and the usual flushing maneuvers were undertaken. Anastomosis completed and flow was short first the external and then the internal carotid arteries. Excellent flow characteristics were noted with hand-held Doppler in the internal and external carotid arteries. Wounds irrigated with saline. Hemostasis cautery. Wounds were closed with 3-0 Vicryl sutures reapproximate sternocleidomastoid and the carotid sheath. Next the platysma was closed with a running 3-0 Vicryl suture and find the skin was closed with a 4 subcuticular Vicryl stitch. Sterile dressing was applied. The patient was wakened neurologically his baseline and was transferred to the recovery room in stable condition   Rosetta Posner, M.D., [facility] 11/05/2016 1:52 PM

## 2016-11-05 NOTE — Anesthesia Postprocedure Evaluation (Signed)
Anesthesia Post Note  Patient: Ethan Fields  Procedure(s) Performed: Procedure(s) (LRB): ENDARTERECTOMY CAROTID-LEFT (Left) PATCH ANGIOPLASTY (Left)     Patient location during evaluation: PACU Anesthesia Type: General Level of consciousness: awake, awake and alert and oriented Pain management: pain level controlled Vital Signs Assessment: post-procedure vital signs reviewed and stable Respiratory status: spontaneous breathing and respiratory function stable Cardiovascular status: blood pressure returned to baseline Anesthetic complications: no    Last Vitals:  Vitals:   11/05/16 1943 11/05/16 2000  BP:  122/80  Pulse: 68 67  Resp: (!) 21 20  Temp: 37.7 C     Last Pain:  Vitals:   11/05/16 1943  TempSrc: Oral  PainSc:                  Sylvanna Burggraf COKER

## 2016-11-06 ENCOUNTER — Encounter (HOSPITAL_COMMUNITY): Payer: Self-pay | Admitting: General Practice

## 2016-11-06 LAB — BASIC METABOLIC PANEL
Anion gap: 7 (ref 5–15)
BUN: 8 mg/dL (ref 6–20)
CALCIUM: 8.3 mg/dL — AB (ref 8.9–10.3)
CHLORIDE: 104 mmol/L (ref 101–111)
CO2: 24 mmol/L (ref 22–32)
CREATININE: 1.09 mg/dL (ref 0.61–1.24)
GFR calc non Af Amer: >60 mL/min (ref >60)
GLUCOSE: 99 mg/dL (ref 65–99)
Potassium: 3.4 mmol/L — ABNORMAL LOW (ref 3.5–5.1)
Sodium: 135 mmol/L (ref 135–145)

## 2016-11-06 LAB — CBC
HEMATOCRIT: 36.3 % — AB (ref 39.0–52.0)
HEMOGLOBIN: 12.3 g/dL — AB (ref 13.0–17.0)
MCH: 30.4 pg (ref 26.0–34.0)
MCHC: 33.9 g/dL (ref 30.0–36.0)
MCV: 89.6 fL (ref 78.0–100.0)
Platelets: 213 10*3/uL (ref 150–400)
RBC: 4.05 MIL/uL — ABNORMAL LOW (ref 4.22–5.81)
RDW: 16.6 % — AB (ref 11.5–15.5)
WBC: 6.9 10*3/uL (ref 4.0–10.5)

## 2016-11-06 NOTE — Care Management Note (Signed)
Case Management Note Marvetta Gibbons RN, BSN Unit 4E-Case Manager [phone]  Patient Details  Name: Ethan Fields MRN: [id number] Date of Birth: 1956-01-31  Subjective/Objective:    Pt admitted s/p CEA on 11/05/16                Action/Plan: PTA pt lived at home- plan to return home- no CM needs noted for discharge.  Expected Discharge Date:  11/06/16               Expected Discharge Plan:  Home/Self Care  In-House Referral:  NA  Discharge planning Services  CM Consult  Post Acute Care Choice:  NA Choice offered to:  NA  DME Arranged:  N/A DME Agency:  NA  HH Arranged:  NA HH Agency:  NA  Status of Service:  Completed, signed off  If discussed at [place] of Stay Meetings, dates discussed:    Discharge Disposition: home/self care   Additional Comments:  Dawayne Patricia, RN 11/06/2016, 11:16 AM

## 2016-11-06 NOTE — Progress Notes (Signed)
Subjective: Interval History: none.. Comfortable. Reports mild left neck soreness.  Objective: Vital signs in last 24 hours: Temp:  [97.4 F (36.3 C)-99.8 F (37.7 C)] 98.5 F (36.9 C) (07/19 0602) Pulse Rate:  [52-70] 58 (07/19 0602) Resp:  [14-24] 18 (07/19 0602) BP: (105-194)/(63-100) 154/82 (07/19 0602) SpO2:  [92 %-100 %] 95 % (07/19 0602) Arterial Line BP: (141-184)/(60-84) 145/60 (07/18 1850)  Intake/Output from previous day: 07/18 0701 - 07/19 0700 In: 1686.7 [I.V.:1686.7] Out: 1050 [Urine:900; Blood:150] Intake/Output this shift: No intake/output data recorded.  Neck without hematoma. Neurologically at baseline. Mild weakness in his right hand.  Lab Results:  Recent Labs  11/05/16 2124 11/06/16 0317  WBC 8.1 6.9  HGB 13.4 12.3*  HCT 38.8* 36.3*  PLT 228 213   BMET  Recent Labs  11/05/16 2124 11/06/16 0317  NA  --  135  K  --  3.4*  CL  --  104  CO2  --  24  GLUCOSE  --  99  BUN  --  8  CREATININE 1.05 1.09  CALCIUM  --  8.3*    Studies/Results: Ct Head Wo Contrast  Result Date: 10/28/2016 CLINICAL DATA:  Intermittent RIGHT-sided weakness for 2 weeks, prior data set beginning at 11 a.m. EXAM: CT HEAD WITHOUT CONTRAST TECHNIQUE: Contiguous axial images were obtained from the base of the skull through the vertex without intravenous contrast. COMPARISON:  MRI of the head November 03, 2009 FINDINGS: BRAIN: No intraparenchymal hemorrhage, mass effect nor midline shift. New small LEFT frontal LEFT parietal lobe infarcts. Old bilateral basal ganglia infarcts with mild ex vacuo dilatation suggests and ventricles, ventricles and sulci are overall normal for patient's age. The RIGHT frontal lobe encephalomalacia present on prior MRI. Patchy supratentorial white matter hypodensities progressed from prior imaging. No abnormal extra-axial fluid collections. Basal cisterns are patent. VASCULAR: Moderate calcific atherosclerosis of the carotid siphons. Slightly dense middle  cerebral artery's, however this is relatively symmetric and likely from hemoconcentration. SKULL: No skull fracture. No significant scalp soft tissue swelling. SINUSES/ORBITS: The mastoid air-cells and included paranasal sinuses are well-aerated.The included ocular globes and orbital contents are non-suspicious. OTHER: None. IMPRESSION: 1. New small LEFT frontoparietal lobe/MCA territory infarcts could be acute given patient's symptoms. 2. Old RIGHT frontal/MCA territory infarct. Old basal ganglia infarcts. 3. Moderate chronic small vessel ischemic disease, advanced from 2011. 4. Moderate atherosclerosis carotid siphon, advanced for age. Critical Value/emergent results were called by telephone at the time of interpretation on 10/28/2016 at 1:47 pm to Dr. Carmin Muskrat , who verbally acknowledged these results. Electronically Signed   By: Elon Alas M.D.   On: 10/28/2016 13:48   Mr Jodene Nam Head Wo Contrast  Result Date: 10/28/2016 CLINICAL DATA:  Right arm weakness.  Stroke. EXAM: MRI HEAD WITHOUT CONTRAST MRA HEAD WITHOUT CONTRAST TECHNIQUE: Multiplanar, multiecho pulse sequences of the brain and surrounding structures were obtained without intravenous contrast. Angiographic images of the head were obtained using MRA technique without contrast. COMPARISON:  CT 10/28/2016 FINDINGS: MRI HEAD FINDINGS Brain: Patchy areas of acute infarct in the left frontal lobe extending posteriorly into the deep white matter and to the left occipital parietal lobe. This is in the watershed territory. Negative for hemorrhage. No other acute infarct. Negative for mass or edema. Chronic microvascular ischemic change in the white matter bilaterally. Chronic right frontal convexity infarct. Chronic infarct left thalamus and in the central pons. Vascular: Normal arterial flow voids bilaterally. Skull and upper cervical spine: Negative Sinuses/Orbits: Negative Other: None MRA HEAD  FINDINGS Both vertebral arteries are widely patent.  Basilar widely patent. Superior cerebellar and posterior cerebral arteries patent Internal carotid artery widely patent bilaterally. Anterior and middle cerebral arteries appear widely patent Negative for cerebral aneurysm. IMPRESSION: Acute watershed infarct on the left in the frontal and parietal lobe. Negative for hemorrhage Atrophy with moderate to advanced chronic ischemic change Negative MRA head Electronically Signed   By: Franchot Gallo M.D.   On: 10/28/2016 15:03   Mr Brain Wo Contrast  Result Date: 10/28/2016 CLINICAL DATA:  Right arm weakness.  Stroke. EXAM: MRI HEAD WITHOUT CONTRAST MRA HEAD WITHOUT CONTRAST TECHNIQUE: Multiplanar, multiecho pulse sequences of the brain and surrounding structures were obtained without intravenous contrast. Angiographic images of the head were obtained using MRA technique without contrast. COMPARISON:  CT 10/28/2016 FINDINGS: MRI HEAD FINDINGS Brain: Patchy areas of acute infarct in the left frontal lobe extending posteriorly into the deep white matter and to the left occipital parietal lobe. This is in the watershed territory. Negative for hemorrhage. No other acute infarct. Negative for mass or edema. Chronic microvascular ischemic change in the white matter bilaterally. Chronic right frontal convexity infarct. Chronic infarct left thalamus and in the central pons. Vascular: Normal arterial flow voids bilaterally. Skull and upper cervical spine: Negative Sinuses/Orbits: Negative Other: None MRA HEAD FINDINGS Both vertebral arteries are widely patent. Basilar widely patent. Superior cerebellar and posterior cerebral arteries patent Internal carotid artery widely patent bilaterally. Anterior and middle cerebral arteries appear widely patent Negative for cerebral aneurysm. IMPRESSION: Acute watershed infarct on the left in the frontal and parietal lobe. Negative for hemorrhage Atrophy with moderate to advanced chronic ischemic change Negative MRA head Electronically  Signed   By: Franchot Gallo M.D.   On: 10/28/2016 15:03   US Carotid Bilateral (at [facility] Only)  Result Date: 10/29/2016 CLINICAL DATA:  Acute CVA.  Right-sided weakness for 2 weeks. EXAM: BILATERAL CAROTID DUPLEX ULTRASOUND TECHNIQUE: Pearline Cables scale imaging, color Doppler and duplex ultrasound were performed of bilateral carotid and vertebral arteries in the neck. COMPARISON:  None. FINDINGS: Criteria: Quantification of carotid stenosis is based on velocity parameters that correlate the residual internal carotid diameter with NASCET-based stenosis levels, using the diameter of the distal internal carotid lumen as the denominator for stenosis measurement. The following velocity measurements were obtained: RIGHT ICA:  76 cm/sec CCA:  59 cm/sec SYSTOLIC ICA/CCA RATIO:  1.3 DIASTOLIC ICA/CCA RATIO:  2.5 ECA:  59 cm/sec LEFT ICA:  240 cm/sec CCA:  65 cm/sec SYSTOLIC ICA/CCA RATIO:  3.7 DIASTOLIC ICA/CCA RATIO:  4.3 ECA:  66 cm/sec RIGHT CAROTID ARTERY: Little if any plaque in the bulb. Mild irregular plaque in the proximal internal carotid artery. Low resistance internal carotid Doppler pattern. RIGHT VERTEBRAL ARTERY:  Antegrade. LEFT CAROTID ARTERY: Moderate irregular plaque within the mid common carotid artery. Extensive calcified plaque in the bulb which shadows the lumen. Low resistance internal carotid Doppler pattern. There is turbulence. The internal carotid is moderately tortuous. LEFT VERTEBRAL ARTERY:  Antegrade. IMPRESSION: Less than 50% stenosis in the right internal carotid artery. 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 Electronically Signed   By: Marybelle Killings M.D.   On: 10/29/2016 11:53   Anti-infectives: Anti-infectives    Start     Dose/Rate Route Frequency Ordered Stop   11/05/16 2300  cefUROXime (ZINACEF) 1.5 g in dextrose 5 % 50 mL IVPB     1.5 g 100 mL/hr over 30 Minutes Intravenous Every 12 hours 11/05/16 1557  11/06/16 2259   11/05/16 0738  cefUROXime (ZINACEF)  1.5 g in dextrose 5 % 50 mL IVPB     1.5 g 100 mL/hr over 30 Minutes Intravenous 30 min pre-op 11/05/16 0738 11/05/16 1135   11/05/16 0725  dextrose 5 % with cefUROXime (ZINACEF) ADS Med    Comments:  Laurita Quint   : cabinet override      11/05/16 0725 11/05/16 1115      Assessment/Plan: s/p Procedure(s): ENDARTERECTOMY CAROTID-LEFT (Left) PATCH ANGIOPLASTY (Left) Stable postop day 1. No postoperative difficulties. Swallowing without difficulty. Will dose discharge later today.   LOS: 1 day   Curt Jews 11/06/2016, 7:10 AM

## 2016-11-06 NOTE — Progress Notes (Signed)
Patient given discharge instructions medication list and paper prescriptions. Patient questions were answered. IV and tele were dcd will discharge home as ordered. Tangy Drozdowski, Bettina Gavia RN

## 2016-11-06 NOTE — Discharge Instructions (Signed)
° °  [facility] ° °Discharge Instructions °  °Carotid Endarterectomy (CEA) ° °Please refer to the following instructions for your post-procedure care. Your surgeon or physician assistant will discuss any changes with you. ° °Activity ° °You are encouraged to walk as much as you can. You can slowly return to normal activities but must avoid strenuous activity and heavy lifting until your doctor tell you it's OK. Avoid activities such as vacuuming or swinging a golf club. You can drive after one week if you are comfortable and you are no longer taking prescription pain medications. It is normal to feel tired for serval weeks after your surgery. It is also normal to have difficulty with sleep habits, eating, and bowel movements after surgery. These will go away with time. ° °Bathing/Showering ° °You may shower after you go home. Do not soak in a bathtub, hot tub, or swim until the incision heals completely. ° °Incision Care ° °Shower every day. Clean your incision with mild soap and water. Pat the area dry with a clean towel. You do not need a bandage unless otherwise instructed. Do not apply any ointments or creams to your incision. You may have skin glue on your incision. Do not peel it off. It will come off on its own in about one week. Your incision may feel thickened and raised for several weeks after your surgery. This is normal and the skin will soften over time. For Men Only: It's OK to shave around the incision but do not shave the incision itself for 2 weeks. It is common to have numbness under your chin that could last for several months. ° °Diet ° °Resume your normal diet. There are no special food restrictions following this procedure. A low fat/low cholesterol diet is recommended for all patients with vascular disease. In order to heal from your surgery, it is CRITICAL to get adequate nutrition. Your body requires vitamins, minerals, and protein. Vegetables are the best  source of vitamins and minerals. Vegetables also provide the perfect balance of protein. Processed food has little nutritional value, so try to avoid this. ° °Medications ° °Resume taking all of your medications unless your doctor or physician assistant tells you not to. If your incision is causing pain, you may take over-the- counter pain relievers such as acetaminophen (Tylenol). If you were prescribed a stronger pain medication, please be aware these medications can cause nausea and constipation. Prevent nausea by taking the medication with a snack or meal. Avoid constipation by drinking plenty of fluids and eating foods with a high amount of fiber, such as fruits, vegetables, and grains. Do not take Tylenol if you are taking prescription pain medications. ° °Follow Up ° °Our office will schedule a follow up appointment 2-3 weeks following discharge. ° °Please call us immediately for any of the following conditions ° °Increased pain, redness, drainage (pus) from your incision site. °Fever of 101 degrees or higher. °If you should develop stroke (slurred speech, difficulty swallowing, weakness on one side of your body, loss of vision) you should call 911 and go to the nearest emergency room. ° °Reduce your risk of vascular disease: ° °Stop smoking. If you would like help call [facility] at 1-800-QUIT-NOW (1-800-784-8669) or [place] at 336-586-4000. °Manage your cholesterol °Maintain a desired weight °Control your diabetes °Keep your blood pressure down ° °If you have any questions, please call the office at [phone]. ° °

## 2016-11-07 NOTE — Discharge Summary (Signed)
[facility] Discharge Summary  Ethan Fields [birth year] 61 y.o. male  [id number]  Admission Date: 11/05/2016  Discharge Date: 11/06/2016  Physician: Curt Jews, MD  Admission Diagnosis: Left Internal Carotid Artery Stenosis  I65.22  HPI:   This is a 61 y.o. male who recently suffered a left brain stroke.Ethan Fields He was admitted to [facility]. He had right arm and right leg weakness. MRI scan showed acute left frontal and parietal lobe infarcts. The patient did have a stroke in the remote past involving the right MCA. He has had no other symptoms recently. 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 He currently is on aspirin and Plavix. He is a former smoker but quit 8 years ago. He is currently on Lipitor aspirin and Plavix. He still has some residual right hand clumsiness and mild right leg weakness. He is currently walking with a cane. He denies any slurred speech but states that he did have some changes in his speech at the time of the acute event. This seems to have resolved.  Family history is remarkable for family members having strokes in their 73s and 10s in multiple family members.  Other medical problems include coronary artery disease, hyperlipidemia, hypertension all of which are been stable.  Hospital Course:  The patient was admitted to the hospital and taken to the operating room on 11/05/2016 and underwent left carotid endarterectomy.  The patient tolerated the procedure well and was transported to the PACU in stable condition.  By POD 1, the patient's neuro status was at baseline. He had mild weakness in his right hand. His neck incision was without hematoma. He was ambulating, voiding and tolerating a diet without difficulty. He was discharged home on postop day 1 in good condition.  Recent Labs  11/06/16 0317  NA 135  K 3.4*  CL 104  CO2 24  GLUCOSE 99  BUN 8  CALCIUM 8.3*    Recent Labs  11/05/16 2124 11/06/16 0317  WBC  8.1 6.9  HGB 13.4 12.3*  HCT 38.8* 36.3*  PLT 228 213    Recent Labs  11/05/16 0750  INR 1.02    Discharge Instructions:   The patient is discharged to home with extensive instructions on wound care and progressive ambulation.  They are instructed not to drive or perform any heavy lifting until returning to see the physician in his office.  Discharge Instructions    CAROTID Sugery: Call MD for difficulty swallowing or speaking; weakness in arms or legs that is a new symtom; severe headache.  If you have increased swelling in the neck and/or  are having difficulty breathing, CALL 911    Complete by:  As directed    Call MD for:  redness, tenderness, or signs of infection (pain, swelling, bleeding, redness, odor or green/yellow discharge around incision site)    Complete by:  As directed    Call MD for:  severe or increased pain, loss or decreased feeling  in affected limb(s)    Complete by:  As directed    Call MD for:  temperature >100.5    Complete by:  As directed    Discharge wound care:    Complete by:  As directed    Shower daily. Wash neck incision daily with soap and water and pat dry. Do not apply any creams or ointments to your incision. Do not shave around your incision until instructed to do so by your surgeon.   Driving Restrictions  Complete by:  As directed    No driving for 2 weeks and while on pain medication.   Increase activity slowly    Complete by:  As directed    Please refer to your discharge instructions.   Lifting restrictions    Complete by:  As directed    No heavy lifting for 4 weeks      Discharge Diagnosis:  Left Internal Carotid Artery Stenosis  I65.22  Secondary Diagnosis: Patient Active Problem List   Diagnosis Date Noted  . Symptomatic stenosis of left carotid artery 11/05/2016  . CVA (cerebral vascular accident) ([place]) 10/29/2016  . HTN (hypertension) 10/28/2016  . Acute CVA (cerebrovascular accident) ([place]) 10/28/2016  . HLD  (hyperlipidemia) 01/31/2009  . CAD, NATIVE VESSEL 01/31/2009   Past Medical History:  Diagnosis Date  . CAD (coronary artery disease)   . Hyperlipidemia   . Hypertension   . Myocardial infarct ([place]) 2010  . Stroke [facility]) 2011   TIA, 10/2016- Stroke - right side weakness    Allergies as of 11/06/2016      Reactions   No Known Allergies       Medication List    TAKE these medications   amLODipine 5 MG tablet Commonly known as:  NORVASC Take 5 mg by mouth daily.   aspirin 325 MG tablet Take 1 tablet (325 mg total) by mouth daily.   atorvastatin 80 MG tablet Commonly known as:  LIPITOR Take 1 tablet (80 mg total) by mouth daily at 6 PM. What changed:  Another medication with the same name was removed. Continue taking this medication, and follow the directions you see here.   carvedilol 6.25 MG tablet Commonly known as:  COREG Take 6.25 mg by mouth 2 (two) times daily with a meal.   clopidogrel 75 MG tablet Commonly known as:  PLAVIX Take 75 mg by mouth daily.   lisinopril 30 MG tablet Commonly known as:  PRINIVIL,ZESTRIL Take 30 mg by mouth daily.   oxyCODONE-acetaminophen 5-325 MG tablet Commonly known as:  PERCOCET/ROXICET Take 1-2 tablets by mouth every 6 (six) hours as needed for moderate pain.      Percocet #8 No Refill  Disposition: Home  Patient's condition: is Good  Follow up: 1. Dr.  Donnetta Hutching in 2 weeks.   Virgina Jock, PA-C [facility] [phone]  --- For [facility] use --- Instructions: Press F2 to tab through selections.  Delete question if not applicable.   Modified Rankin score at D/C (0-6): 0  IV medication needed for:  1. Hypertension: Yes 2. Hypotension: No  Post-op Complications: No  1. Post-op CVA or TIA: No  2. CN injury: No  3. Myocardial infarction: No  4.  CHF: No  5.  Dysrhythmia (new): No  6. Wound infection: No  7. Reperfusion symptoms: No  8. Return to OR: No  Discharge  medications: Statin use:  Yes If No: [ ]  For Medical reasons, [ ]  Non-compliant, [ ]  Not-indicated ASA use:  Yes  If No: [ ]  For Medical reasons, [ ]  Non-compliant, [ ]  Not-indicated Beta blocker use:  Yes If No: [ ]  For Medical reasons, [ ]  Non-compliant, [ ]  Not-indicated ACE-Inhibitor use:  Yes If No: [ ]  For Medical reasons, [ ]  Non-compliant, [ ]  Not-indicated P2Y12 Antagonist use: Yes, [x ] Plavix, [ ]  Plasugrel, [ ]  Ticlopinine, [ ]  Ticagrelor, [ ]  Other, [ ]  No for medical reason, [ ]  Non-compliant, [ ]  Not-indicated Anti-coagulant use:  No, [ ]   Warfarin, [ ]  Rivaroxaban, [ ]  Dabigatran, [ ]  Other, [ ]  No for medical reason, [ ]  Non-compliant, [ ]  Not-indicated

## 2016-11-11 ENCOUNTER — Encounter: Payer: Self-pay | Admitting: Vascular Surgery

## 2016-11-14 NOTE — Progress Notes (Signed)
CODE STROKE NOTES FROM 10/28/2016 1304 CALL TIME 1311 BEEPER ' 1324 EXAM STARTED 1327 EXAM FINISHED 1329 IMAGES SENT TO SOC 1331 EXAM COMPLETED IN EPIC 1335 [facility] CALLED

## 2016-11-26 ENCOUNTER — Encounter: Payer: BLUE CROSS/BLUE SHIELD | Admitting: Vascular Surgery

## 2016-12-02 ENCOUNTER — Ambulatory Visit (INDEPENDENT_AMBULATORY_CARE_PROVIDER_SITE_OTHER): Payer: Self-pay | Admitting: Family

## 2016-12-02 ENCOUNTER — Encounter: Payer: Self-pay | Admitting: Family

## 2016-12-02 VITALS — BP 151/98 | HR 66 | Temp 96.7°F | Resp 18 | Ht 71.0 in | Wt 158.0 lb

## 2016-12-02 DIAGNOSIS — Z9889 Other specified postprocedural states: Secondary | ICD-10-CM

## 2016-12-02 DIAGNOSIS — I6523 Occlusion and stenosis of bilateral carotid arteries: Secondary | ICD-10-CM

## 2016-12-02 NOTE — Patient Instructions (Signed)
Stroke Prevention Some medical conditions and behaviors are associated with an increased chance of having a stroke. You may prevent a stroke by making healthy choices and managing medical conditions. How can I reduce my risk of having a stroke?  Stay physically active. Get at least 30 minutes of activity on most or all days.  Do not smoke. It may also be helpful to avoid exposure to secondhand smoke.  Limit alcohol use. Moderate alcohol use is considered to be:  No more than 2 drinks per day for men.  No more than 1 drink per day for nonpregnant women.  Eat healthy foods. This involves:  Eating 5 or more servings of fruits and vegetables a day.  Making dietary changes that address high blood pressure (hypertension), high cholesterol, diabetes, or obesity.  Manage your cholesterol levels.  Making food choices that are high in fiber and low in saturated fat, trans fat, and cholesterol may control cholesterol levels.  Take any prescribed medicines to control cholesterol as directed by your health care provider.  Manage your diabetes.  Controlling your carbohydrate and sugar intake is recommended to manage diabetes.  Take any prescribed medicines to control diabetes as directed by your health care provider.  Control your hypertension.  Making food choices that are low in salt (sodium), saturated fat, trans fat, and cholesterol is recommended to manage hypertension.  Ask your health care provider if you need treatment to lower your blood pressure. Take any prescribed medicines to control hypertension as directed by your health care provider.  If you are 18-39 years of age, have your blood pressure checked every 3-5 years. If you are 40 years of age or older, have your blood pressure checked every year.  Maintain a healthy weight.  Reducing calorie intake and making food choices that are low in sodium, saturated fat, trans fat, and cholesterol are recommended to manage  weight.  Stop drug abuse.  Avoid taking birth control pills.  Talk to your health care provider about the risks of taking birth control pills if you are over 35 years old, smoke, get migraines, or have ever had a blood clot.  Get evaluated for sleep disorders (sleep apnea).  Talk to your health care provider about getting a sleep evaluation if you snore a lot or have excessive sleepiness.  Take medicines only as directed by your health care provider.  For some people, aspirin or blood thinners (anticoagulants) are helpful in reducing the risk of forming abnormal blood clots that can lead to stroke. If you have the irregular heart rhythm of atrial fibrillation, you should be on a blood thinner unless there is a good reason you cannot take them.  Understand all your medicine instructions.  Make sure that other conditions (such as anemia or atherosclerosis) are addressed. Get help right away if:  You have sudden weakness or numbness of the face, arm, or leg, especially on one side of the body.  Your face or eyelid droops to one side.  You have sudden confusion.  You have trouble speaking (aphasia) or understanding.  You have sudden trouble seeing in one or both eyes.  You have sudden trouble walking.  You have dizziness.  You have a loss of balance or coordination.  You have a sudden, severe headache with no known cause.  You have new chest pain or an irregular heartbeat. Any of these symptoms may represent a serious problem that is an emergency. Do not wait to see if the symptoms will go away.   Get medical help at once. Call your local emergency services (911 in [place] Do not drive yourself to the hospital. This information is not intended to replace advice given to you by your health care provider. Make sure you discuss any questions you have with your health care provider. Document Released: 05/15/2004 Document Revised: 09/13/2015 Document Reviewed: 10/08/2012 Elsevier  Interactive Patient Education  2017 [employer]  

## 2016-12-02 NOTE — Progress Notes (Signed)
Postoperative Visit   History of Present Illness  Ethan Fields is a 61 y.o. male who is s/p left carotid endarterectomy and Dacron patch angioplasty on 11-05-16 by Dr. Donnetta Hutching for symptomatic left carotid artery stenosis. He returns today for his 2 week follow up.  He denies any subsequent stroke or TIA sx's. He denies any difficulty swallowing. He reports some numb feeling beneath the left side of his mandible. I advised electric razor use until this improves.   The patient's neck incision is healed.    Pt states his blood pressure was 127/80 at his PCP office last week, is slightly elevated today.   For VQI Use Only  PRE-ADM LIVING: Home  AMB STATUS: Ambulatory   Past Medical History:  Diagnosis Date  . CAD (coronary artery disease)   . Hyperlipidemia   . Hypertension   . Myocardial infarct ([place]) 2010  . Stroke [facility]) 2011   TIA, 10/2016- Stroke - right side weakness    Past Surgical History:  Procedure Laterality Date  . CAROTID ENDARTERECTOMY Left 11/05/2016  . COLONOSCOPY    . ENDARTERECTOMY Left 11/05/2016   Procedure: ENDARTERECTOMY CAROTID-LEFT;  Surgeon: Rosetta Posner, MD;  Location: [place];  Service: Vascular;  Laterality: Left;  . PATCH ANGIOPLASTY Left 11/05/2016   Procedure: PATCH ANGIOPLASTY;  Surgeon: Rosetta Posner, MD;  Location: [place];  Service: Vascular;  Laterality: Left;   Allergies  Allergen Reactions  . No Known Allergies      Social History   Social History  . Marital status: Single    Spouse name: N/A  . Number of children: N/A  . Years of education: N/A   Occupational History  . Not on file.   Social History Main Topics  . Smoking status: Former Smoker    Years: 35.00    Types: Cigarettes    Quit date: 2010  . Smokeless tobacco: Never Used  . Alcohol use Yes     Comment: occasional  . Drug use: Yes    Types: Marijuana  . Sexual activity: Not on file   Other Topics Concern  . Not on file   Social History Narrative  .  No narrative on file    Current Outpatient Prescriptions on File Prior to Visit  Medication Sig Dispense Refill  . amLODipine (NORVASC) 5 MG tablet Take 5 mg by mouth daily.  6  . aspirin 325 MG tablet Take 1 tablet (325 mg total) by mouth daily.    . carvedilol (COREG) 6.25 MG tablet Take 6.25 mg by mouth 2 (two) times daily with a meal.    . clopidogrel (PLAVIX) 75 MG tablet Take 75 mg by mouth daily.    [name] lisinopril (PRINIVIL,ZESTRIL) 30 MG tablet Take 30 mg by mouth daily.    [name] atorvastatin (LIPITOR) 80 MG tablet Take 1 tablet (80 mg total) by mouth daily at 6 PM. (Patient not taking: Reported on 12/02/2016) 30 tablet 2  . oxyCODONE-acetaminophen (PERCOCET/ROXICET) 5-325 MG tablet Take 1-2 tablets by mouth every 6 (six) hours as needed for moderate pain. (Patient not taking: Reported on 12/02/2016) 8 tablet 0   No current facility-administered medications on file prior to visit.     Physical Examination  Vitals:   12/02/16 0847 12/02/16 0849 12/02/16 0850  BP: (!) 156/100 (!) 147/99 (!) 151/98  Pulse: 65 66 66  Resp: 18    Temp: (!) 96.7 F (35.9 C)    TempSrc: Oral    SpO2: 100%  Weight: 158 lb (71.7 kg)    Height: 5\' 11"  (1.803 m)     Body mass index is 22.04 kg/m.  Left Neck: Incision is healed Neuro: CN 2-12 are intact, Motor strength is 5/5 bilaterally, sensation is grossly intact.  Medical Decision Making  Ethan Fields is a 61 y.o. male who presents s/p left carotid endarterectomy and Dacron patch angioplasty on 11-05-16.   The patient's neck incision is healed with no stroke symptoms.  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   He has already taken showers and is driving.   I discussed in depth with the patient the nature of atherosclerosis, and emphasized the importance  of maximal medical management including strict control of blood pressure, blood glucose, and lipid levels, obtaining regular exercise, anti-platelet use and cessation of smoking.   The patient is currently on dual antiplatelet therapy The patient is currently on a statin.  The patient is aware that without maximal medical management the underlying atherosclerotic disease process will progress, limiting the benefit of any interventions. The patient's surveillance will included routine carotid duplex studies which will be completed in: 5 months, at which time the patient will be re-evaluated.   I emphasized the importance of routine surveillance of the carotid arteries as recurrence of stenosis is possible, especially with proper management of underlying atherosclerotic disease. The patient agrees to participate in their maximal medical care and routine surveillance.  Thank you for allowing [place] to participate in this patient's care.  Uel Davidow, Sharmon Leyden, RN, MSN, FNP-C [facility] Office: [phone]  12/02/2016, 9:30 AM  Clinic MD: Early

## 2016-12-04 NOTE — Addendum Note (Signed)
Addended by: Lianne Cure A on: 12/04/2016 08:49 AM   Modules accepted: Orders

## 2017-05-05 ENCOUNTER — Ambulatory Visit: Payer: BLUE CROSS/BLUE SHIELD | Admitting: Family

## 2017-05-05 ENCOUNTER — Encounter (HOSPITAL_COMMUNITY): Payer: BLUE CROSS/BLUE SHIELD

## 2020-02-15 ENCOUNTER — Encounter: Payer: Self-pay | Admitting: Internal Medicine

## 2020-03-20 ENCOUNTER — Ambulatory Visit (INDEPENDENT_AMBULATORY_CARE_PROVIDER_SITE_OTHER): Payer: Medicare Other | Admitting: Gastroenterology

## 2020-03-20 ENCOUNTER — Encounter: Payer: Self-pay | Admitting: Gastroenterology

## 2020-03-20 ENCOUNTER — Other Ambulatory Visit: Payer: Self-pay

## 2020-03-20 VITALS — BP 113/81 | HR 66 | Temp 96.8°F | Ht 71.0 in | Wt 165.6 lb

## 2020-03-20 DIAGNOSIS — R748 Abnormal levels of other serum enzymes: Secondary | ICD-10-CM | POA: Diagnosis not present

## 2020-03-20 DIAGNOSIS — Z8719 Personal history of other diseases of the digestive system: Secondary | ICD-10-CM

## 2020-03-20 NOTE — Progress Notes (Signed)
Primary Care Physician:  Monico Blitz, MD  Primary Gastroenterologist:  Garfield Cornea, MD   Chief Complaint  Patient presents with  . Pancreatitis    HPI:  Ethan Fields is a 64 y.o. male here at the request of Dr. Manuella Ghazi for further evaluation of pancreatitis.  Patient recently admitted in September at [facility] with complaints of epigastric pain associated with nausea and vomiting also complained of cough and congestion.  Tested negative for Covid.  CTA chest with no evidence of PE.  He had emphysematous changes in the lungs.  He had scattered calcified nodules likely reflecting granulomata and noncalcified nodules likely postinfectious or inflammatory.  Borderline enlarged central pulmonary arteries suggestive of pulmonary arterial hypertension.  CT abdomen pelvis with contrast with normal liver, gallbladder, biliary tree.  Pancreas unremarkable.  Borderline enlarged prostate, multilevel degenerative changes of the lumbar spine with mild canal stenosis and moderate to severe bilateral foraminal narrowing at L5-S1.  Abdominal ultrasound unremarkable.  During his admission his lipase was elevated initially at 278, at the highest was 390, at discharge 178.  January 18, 2020: AST 28, ALT 37, alk phos 68, BUN 29, creatinine 1.26.  January 14, 2020: Hemoglobin 18, hematocrit 53, white blood cell count 10,800, platelets 346,000.  Latest labs on January 26, 2020 with glucose of 94, BUN 24, creatinine 1.22, potassium 4.1, albumin 3.7, total bilirubin 0.6, alk phos 63, AST 34, ALT 34, lipase 152.  Patient states his abdominal pain lasted for a few days.  He has had similar pain in the past but nothing as severe.  Denies any nausea or vomiting.  His appetite is good.  Has had no weight loss.  Bowel movements are regular.  No melena or rectal bleeding.  No heartburn.  He feels fully recovered.  Patient states he used to drink alcohol regularly in the past but is been over 20 years.   Denies any recent medication changes prior to onset of abdominal pain.  Prior colonoscopy at [facility].   Current Outpatient Medications  Medication Sig Dispense Refill  . amLODipine (NORVASC) 5 MG tablet Take 5 mg by mouth daily.  6  . atorvastatin (LIPITOR) 20 MG tablet Take 20 mg by mouth daily.  7  . carvedilol (COREG) 6.25 MG tablet Take 6.25 mg by mouth 2 (two) times daily with a meal.    . clopidogrel (PLAVIX) 75 MG tablet Take 75 mg by mouth daily.    . hydrochlorothiazide (HYDRODIURIL) 25 MG tablet Take 25 mg by mouth daily.     No current facility-administered medications for this visit.    Allergies as of 03/20/2020 - Review Complete 03/20/2020  Allergen Reaction Noted  . Lisinopril Swelling 03/20/2020  . No known allergies  11/04/2016    Past Medical History:  Diagnosis Date  . CAD (coronary artery disease)   . Hyperlipidemia   . Hypertension   . Myocardial infarct ([place]) 2010  . Stroke [facility]) 2011   TIA, 10/2016- Stroke - right side weakness    Past Surgical History:  Procedure Laterality Date  . CAROTID ENDARTERECTOMY Left 11/05/2016  . COLONOSCOPY     normal.  . ENDARTERECTOMY Left 11/05/2016   Procedure: ENDARTERECTOMY CAROTID-LEFT;  Surgeon: Rosetta Posner, MD;  Location: [place];  Service: Vascular;  Laterality: Left;  . PATCH ANGIOPLASTY Left 11/05/2016   Procedure: PATCH ANGIOPLASTY;  Surgeon: Rosetta Posner, MD;  Location: [facility] OR;  Service: Vascular;  Laterality: Left;    Family History  Problem Relation  Age of Onset  . Colon cancer Neg Hx   . Pancreatic disease Neg Hx     Social History   Socioeconomic History  . Marital status: Single    Spouse name: Not on file  . Number of children: Not on file  . Years of education: Not on file  . Highest education level: Not on file  Occupational History  . Not on file  Tobacco Use  . Smoking status: Former Smoker    Years: 35.00    Types: Cigarettes    Quit date: 2010    Years since quitting: 11.9  .  Smokeless tobacco: Never Used  Vaping Use  . Vaping Use: Never used  Substance and Sexual Activity  . Alcohol use: Yes    Comment: no etoh in over 25 years. occasional  . Drug use: Yes    Types: Marijuana  . Sexual activity: Not on file  Other Topics Concern  . Not on file  Social History Narrative  . Not on file   Social Determinants of Health   Financial Resource Strain:   . Difficulty of Paying Living Expenses: Not on file  Food Insecurity:   . Worried About Charity fundraiser in the Last Year: Not on file  . Ran Out of Food in the Last Year: Not on file  Transportation Needs:   . Lack of Transportation (Medical): Not on file  . Lack of Transportation (Non-Medical): Not on file  Physical Activity:   . Days of Exercise per Week: Not on file  . Minutes of Exercise per Session: Not on file  Stress:   . Feeling of Stress : Not on file  Social Connections:   . Frequency of Communication with Friends and Family: Not on file  . Frequency of Social Gatherings with Friends and Family: Not on file  . Attends Religious Services: Not on file  . Active Member of Clubs or Organizations: Not on file  . Attends Archivist Meetings: Not on file  . Marital Status: Not on file  Intimate Partner Violence:   . Fear of Current or Ex-Partner: Not on file  . Emotionally Abused: Not on file  . Physically Abused: Not on file  . Sexually Abused: Not on file      ROS:  General: Negative for anorexia, weight loss, fever, chills, fatigue, weakness. Eyes: Negative for vision changes.  ENT: Negative for hoarseness, difficulty swallowing , nasal congestion. CV: Negative for chest pain, angina, palpitations, dyspnea on exertion, peripheral edema.  Respiratory: Negative for dyspnea at rest, dyspnea on exertion, cough, sputum, wheezing.  GI: See history of present illness. GU:  Negative for dysuria, hematuria, urinary incontinence, urinary frequency, nocturnal urination.  MS: Negative  for joint pain, low back pain.  Derm: Negative for rash or itching.  Neuro: Negative for weakness, abnormal sensation, seizure, frequent headaches, memory loss, confusion.  Psych: Negative for anxiety, depression, suicidal ideation, hallucinations.  Endo: Negative for unusual weight change.  Heme: Negative for bruising or bleeding. Allergy: Negative for rash or hives.    Physical Examination:  BP 113/81   Pulse 66   Temp (!) 96.8 F (36 C) (Temporal)   Ht 5' 11"  (1.803 m)   Wt 165 lb 9.6 oz (75.1 kg)   BMI 23.10 kg/m    General: Well-nourished, well-developed in no acute distress.  Head: Normocephalic, atraumatic.   Eyes: Conjunctiva pink, no icterus. Mouth: masked Neck: Supple without thyromegaly, masses, or lymphadenopathy.  Lungs: Clear to auscultation  bilaterally.  Heart: Regular rate and rhythm, no murmurs rubs or gallops.  Abdomen: Bowel sounds are normal, nontender, nondistended, no hepatosplenomegaly or masses, no abdominal bruits or    hernia , no rebound or guarding.   Rectal: Not performed Extremities: No lower extremity edema. No clubbing or deformities.  Neuro: Alert and oriented x 4 , grossly normal neurologically.  Skin: Warm and dry, no rash or jaundice.   Psych: Alert and cooperative, normal mood and affect.  Labs: See hpi  Imaging Studies: See hpi  Impression/Plan  Pleasant 64 year old male presenting at the request of Dr. Manuella Ghazi for further evaluation pancreatitis.  Patient admitted back in September at [facility] for abdominal pain associated with nausea and vomiting.  Lipase moderately elevated.  CT and ultrasound with benign appearing pancreas.  No evidence of gallstones or common bile duct dilation.  LFTs normal.  As an outpatient lipase remained elevated at 152.  Clinically and biochemically patient appeared to have pancreatitis. Imaging was unremarkable.  Prior history of frequent alcohol use but none in over 20 years.  Denied any recent medication  changes.  Triglyceride levels unknown.  No hypercalcemia  Would consider further imaging via MRI abdomen/MRCP to rule out microlithiasis/underlying malignancy.  Update labs initially.  Patient also should consider follow-up chest CT in 1 year, report provided to patient.  He can discuss with PCP.

## 2020-03-20 NOTE — Patient Instructions (Addendum)
1. Please discuss with Dr. Manuella Ghazi regarding possible need for follow up Chest CT in one year. See CT report.  2. Please have your labs done. We will be in touch to schedule MRI pancreas/abdomen in near future once discuss with Dr. Gala Romney.

## 2020-03-23 ENCOUNTER — Encounter: Payer: Self-pay | Admitting: Gastroenterology

## 2020-03-27 DIAGNOSIS — I25119 Atherosclerotic heart disease of native coronary artery with unspecified angina pectoris: Secondary | ICD-10-CM | POA: Diagnosis not present

## 2020-03-27 DIAGNOSIS — K859 Acute pancreatitis without necrosis or infection, unspecified: Secondary | ICD-10-CM | POA: Diagnosis not present

## 2020-03-27 DIAGNOSIS — Z299 Encounter for prophylactic measures, unspecified: Secondary | ICD-10-CM | POA: Diagnosis not present

## 2020-03-27 DIAGNOSIS — I10 Essential (primary) hypertension: Secondary | ICD-10-CM | POA: Diagnosis not present

## 2020-03-27 DIAGNOSIS — Z2821 Immunization not carried out because of patient refusal: Secondary | ICD-10-CM | POA: Diagnosis not present

## 2020-04-18 ENCOUNTER — Telehealth: Payer: Self-pay | Admitting: Gastroenterology

## 2020-04-18 NOTE — Telephone Encounter (Signed)
Can we find out if patient completed labs I ordered at time of OV? I was waiting for labs prior to considering MR pancreas.

## 2020-04-19 NOTE — Telephone Encounter (Signed)
Lmom, waiting on a return call.  

## 2020-04-24 NOTE — Telephone Encounter (Signed)
Lmom, mailing letter.

## 2020-04-25 NOTE — Telephone Encounter (Signed)
Spoke with pts sister. She is going to talk with pt and have him complete lab work if he hasn't already completed it. She is aware that labs are needed to proceed with the next steps of care for pt.

## 2020-04-30 NOTE — Telephone Encounter (Signed)
Noted  

## 2020-05-02 DIAGNOSIS — Z8719 Personal history of other diseases of the digestive system: Secondary | ICD-10-CM | POA: Diagnosis not present

## 2020-05-02 DIAGNOSIS — R748 Abnormal levels of other serum enzymes: Secondary | ICD-10-CM | POA: Diagnosis not present

## 2020-05-03 LAB — COMPREHENSIVE METABOLIC PANEL
AG Ratio: 1.1 (calc) (ref 1.0–2.5)
ALT: 37 U/L (ref 9–46)
AST: 29 U/L (ref 10–35)
Albumin: 4.5 g/dL (ref 3.6–5.1)
Alkaline phosphatase (APISO): 108 U/L (ref 35–144)
BUN: 21 mg/dL (ref 7–25)
CO2: 29 mmol/L (ref 20–32)
Calcium: 10.3 mg/dL (ref 8.6–10.3)
Chloride: 103 mmol/L (ref 98–110)
Creat: 1.19 mg/dL (ref 0.70–1.25)
Globulin: 4.1 g/dL (calc) — ABNORMAL HIGH (ref 1.9–3.7)
Glucose, Bld: 121 mg/dL — ABNORMAL HIGH (ref 65–99)
Potassium: 4.3 mmol/L (ref 3.5–5.3)
Sodium: 140 mmol/L (ref 135–146)
Total Bilirubin: 0.6 mg/dL (ref 0.2–1.2)
Total Protein: 8.6 g/dL — ABNORMAL HIGH (ref 6.1–8.1)

## 2020-05-03 LAB — CBC WITH DIFFERENTIAL/PLATELET
Absolute Monocytes: 752 cells/uL (ref 200–950)
Basophils Absolute: 11 cells/uL (ref 0–200)
Basophils Relative: 0.3 %
Eosinophils Absolute: 49 cells/uL (ref 15–500)
Eosinophils Relative: 1.3 %
HCT: 43.9 % (ref 38.5–50.0)
Hemoglobin: 15.9 g/dL (ref 13.2–17.1)
Lymphs Abs: 939 cells/uL (ref 850–3900)
MCH: 34.3 pg — ABNORMAL HIGH (ref 27.0–33.0)
MCHC: 36.2 g/dL — ABNORMAL HIGH (ref 32.0–36.0)
MCV: 94.8 fL (ref 80.0–100.0)
MPV: 11.6 fL (ref 7.5–12.5)
Monocytes Relative: 19.8 %
Neutro Abs: 2048 cells/uL (ref 1500–7800)
Neutrophils Relative %: 53.9 %
Platelets: 245 10*3/uL (ref 140–400)
RBC: 4.63 10*6/uL (ref 4.20–5.80)
RDW: 16.7 % — ABNORMAL HIGH (ref 11.0–15.0)
Total Lymphocyte: 24.7 %
WBC: 3.8 10*3/uL (ref 3.8–10.8)

## 2020-05-03 LAB — LIPASE: Lipase: 97 U/L — ABNORMAL HIGH (ref 7–60)

## 2020-05-14 ENCOUNTER — Encounter: Payer: Self-pay | Admitting: *Deleted

## 2020-05-14 ENCOUNTER — Other Ambulatory Visit: Payer: Self-pay | Admitting: *Deleted

## 2020-05-14 DIAGNOSIS — Z8719 Personal history of other diseases of the digestive system: Secondary | ICD-10-CM

## 2020-05-29 DIAGNOSIS — I10 Essential (primary) hypertension: Secondary | ICD-10-CM | POA: Diagnosis not present

## 2020-05-29 DIAGNOSIS — I25119 Atherosclerotic heart disease of native coronary artery with unspecified angina pectoris: Secondary | ICD-10-CM | POA: Diagnosis not present

## 2020-05-29 DIAGNOSIS — Z6824 Body mass index (BMI) 24.0-24.9, adult: Secondary | ICD-10-CM | POA: Diagnosis not present

## 2020-05-29 DIAGNOSIS — K859 Acute pancreatitis without necrosis or infection, unspecified: Secondary | ICD-10-CM | POA: Diagnosis not present

## 2020-05-29 DIAGNOSIS — Z87891 Personal history of nicotine dependence: Secondary | ICD-10-CM | POA: Diagnosis not present

## 2020-05-29 DIAGNOSIS — Z299 Encounter for prophylactic measures, unspecified: Secondary | ICD-10-CM | POA: Diagnosis not present

## 2020-05-29 DIAGNOSIS — I69351 Hemiplegia and hemiparesis following cerebral infarction affecting right dominant side: Secondary | ICD-10-CM | POA: Diagnosis not present

## 2020-06-07 ENCOUNTER — Other Ambulatory Visit: Payer: Self-pay

## 2020-06-07 ENCOUNTER — Ambulatory Visit (HOSPITAL_COMMUNITY)
Admission: RE | Admit: 2020-06-07 | Discharge: 2020-06-07 | Disposition: A | Discharge status: Home / Self Care | Payer: Medicare Other | Source: Ambulatory Visit | Attending: Gastroenterology | Admitting: Gastroenterology

## 2020-06-07 DIAGNOSIS — M5136 Other intervertebral disc degeneration, lumbar region: Secondary | ICD-10-CM | POA: Diagnosis not present

## 2020-06-07 DIAGNOSIS — K319 Disease of stomach and duodenum, unspecified: Secondary | ICD-10-CM | POA: Diagnosis not present

## 2020-06-07 DIAGNOSIS — I700 Atherosclerosis of aorta: Secondary | ICD-10-CM | POA: Insufficient documentation

## 2020-06-07 DIAGNOSIS — Z8719 Personal history of other diseases of the digestive system: Secondary | ICD-10-CM | POA: Diagnosis not present

## 2020-06-07 DIAGNOSIS — K859 Acute pancreatitis without necrosis or infection, unspecified: Secondary | ICD-10-CM | POA: Diagnosis not present

## 2020-06-07 DIAGNOSIS — K3189 Other diseases of stomach and duodenum: Secondary | ICD-10-CM | POA: Diagnosis not present

## 2020-06-07 DIAGNOSIS — K8689 Other specified diseases of pancreas: Secondary | ICD-10-CM | POA: Diagnosis not present

## 2020-06-07 LAB — POCT I-STAT CREATININE: Creatinine, Ser: 1.1 mg/dL (ref 0.61–1.24)

## 2020-06-07 MED ORDER — IOHEXOL 300 MG/ML  SOLN
100.0000 mL | Freq: Once | INTRAMUSCULAR | Status: AC | PRN
  Administered 2020-06-07: 100 mL via INTRAVENOUS

## 2020-06-12 ENCOUNTER — Ambulatory Visit: Payer: Medicare Other | Admitting: Nurse Practitioner

## 2020-06-12 ENCOUNTER — Encounter: Payer: Self-pay | Admitting: Internal Medicine

## 2020-06-12 NOTE — Progress Notes (Deleted)
Referring Provider: Monico Blitz, MD Primary Care Physician:  Monico Blitz, MD Primary GI:  Dr. Gala Romney  No chief complaint on file.   HPI:   Ethan Fields is a 65 y.o. male who presents to schedule upper endoscopy.  The patient was last seen in our office 03/20/2020 for history of pancreatitis and elevated lipase.  Noted admission in September at [facility] with epigastric pain and nausea with vomiting, Covid negative.  CTA chest with no evidence of PE.  Changes of emphysema in the lungs.  CT of the abdomen and pelvis found normal liver, gallbladder, biliary tree.  Pancreas unremarkable.  Borderline enlarged prostate, multilevel degenerative changes of the lumbar spine with mild canal stenosis and moderate severe bilateral foraminal narrowing at L5-S1.  Abdominal ultrasound unremarkable.  During his admission his lipase was elevated initially at 278, at the highest was 390, at discharge 178.  January 18, 2020: AST 28, ALT 37, alk phos 68, BUN 29, creatinine 1.26.  January 14, 2020: Hemoglobin 18, hematocrit 53, white blood cell count 10,800, platelets 346,000.  Latest labs on January 26, 2020 with glucose of 94, BUN 24, creatinine 1.22, potassium 4.1, albumin 3.7, total bilirubin 0.6, alk phos 63, AST 34, ALT 34, lipase 152.  At his last visit he noted abdominal pain lasted for few days and has had previous symptoms similar in the past but not as severe.  He feels fully recovered with no ongoing symptoms.  Used to drink alcohol regularly but not in 20 years.  Prior colonoscopy at [facility].  Recommended possible CT of the chest in 1 year, update labs and schedule MRI of the pancreas/abdomen after completing labs.  Labs are completed 05/02/2020 CBC essentially normal other than increased indices, CMP with stable creatinine at 1.19, glucose elevated mildly at 121, LFTs all normal.  Lipase mildly elevated at 97.  CT of the abdomen and pelvis with and without contrast (CT abdomen pancreatic  protocol) was recommended and completed on 06/07/2020.  Findings included prominent/protruding soft tissue lesion into the duodenum near the pancreatic head which may be a prominent ampulla with recommended EGD for further evaluation.  No pancreatic mass, acute inflammation, or ductal dilation, other incidental findings.  Today he states he is doing okay overall.  Past Medical History:  Diagnosis Date  . CAD (coronary artery disease)   . Hyperlipidemia   . Hypertension   . Myocardial infarct ([place]) 2010  . Stroke [facility]) 2011   TIA, 10/2016- Stroke - right side weakness    Past Surgical History:  Procedure Laterality Date  . CAROTID ENDARTERECTOMY Left 11/05/2016  . COLONOSCOPY     normal.  . ENDARTERECTOMY Left 11/05/2016   Procedure: ENDARTERECTOMY CAROTID-LEFT;  Surgeon: Rosetta Posner, MD;  Location: [place];  Service: Vascular;  Laterality: Left;  . PATCH ANGIOPLASTY Left 11/05/2016   Procedure: PATCH ANGIOPLASTY;  Surgeon: Rosetta Posner, MD;  Location: [facility] OR;  Service: Vascular;  Laterality: Left;    Current Outpatient Medications  Medication Sig Dispense Refill  . amLODipine (NORVASC) 5 MG tablet Take 5 mg by mouth daily.  6  . atorvastatin (LIPITOR) 20 MG tablet Take 20 mg by mouth daily.  7  . carvedilol (COREG) 6.25 MG tablet Take 6.25 mg by mouth 2 (two) times daily with a meal.    . clopidogrel (PLAVIX) 75 MG tablet Take 75 mg by mouth daily.    . hydrochlorothiazide (HYDRODIURIL) 25 MG tablet Take 25 mg by mouth daily.  No current facility-administered medications for this visit.    Allergies as of 06/12/2020 - Review Complete 03/20/2020  Allergen Reaction Noted  . Lisinopril Swelling 03/20/2020  . No known allergies  11/04/2016    Family History  Problem Relation Age of Onset  . Colon cancer Neg Hx   . Pancreatic disease Neg Hx     Social History   Socioeconomic History  . Marital status: Single    Spouse name: Not on file  . Number of children: Not on file   . Years of education: Not on file  . Highest education level: Not on file  Occupational History  . Not on file  Tobacco Use  . Smoking status: Former Smoker    Years: 35.00    Types: Cigarettes    Quit date: 2010    Years since quitting: 12.1  . Smokeless tobacco: Never Used  Vaping Use  . Vaping Use: Never used  Substance and Sexual Activity  . Alcohol use: Yes    Comment: no etoh in over 25 years. occasional  . Drug use: Yes    Types: Marijuana  . Sexual activity: Not on file  Other Topics Concern  . Not on file  Social History Narrative  . Not on file   Social Determinants of Health   Financial Resource Strain: Not on file  Food Insecurity: Not on file  Transportation Needs: Not on file  Physical Activity: Not on file  Stress: Not on file  Social Connections: Not on file    Subjective:*** Review of Systems  Constitutional: Negative for chills, fever, malaise/fatigue and weight loss.  HENT: Negative for congestion and sore throat.   Respiratory: Negative for cough and shortness of breath.   Cardiovascular: Negative for chest pain and palpitations.  Gastrointestinal: Negative for abdominal pain, blood in stool, diarrhea, melena, nausea and vomiting.  Musculoskeletal: Negative for joint pain and myalgias.  Skin: Negative for rash.  Neurological: Negative for dizziness and weakness.  Endo/Heme/Allergies: Does not bruise/bleed easily.  Psychiatric/Behavioral: Negative for depression. The patient is not nervous/anxious.   All other systems reviewed and are negative.    Objective: There were no vitals taken for this visit. Physical Exam Vitals and nursing note reviewed.  Constitutional:      General: He is not in acute distress.    Appearance: Normal appearance. He is not ill-appearing, toxic-appearing or diaphoretic.  HENT:     Head: Normocephalic and atraumatic.     Nose: No congestion or rhinorrhea.  Eyes:     General: No scleral icterus. Cardiovascular:      Rate and Rhythm: Normal rate and regular rhythm.     Heart sounds: Normal heart sounds.  Pulmonary:     Effort: Pulmonary effort is normal.     Breath sounds: Normal breath sounds.  Abdominal:     General: Bowel sounds are normal. There is no distension.     Palpations: Abdomen is soft. There is no hepatomegaly, splenomegaly or mass.     Tenderness: There is no abdominal tenderness. There is no guarding or rebound.     Hernia: No hernia is present.  Musculoskeletal:     Cervical back: Neck supple.  Skin:    General: Skin is warm and dry.     Coloration: Skin is not jaundiced.     Findings: No bruising or rash.  Neurological:     General: No focal deficit present.     Mental Status: He is alert and oriented to person, place,  and time. Mental status is at baseline.  Psychiatric:        Mood and Affect: Mood normal.        Behavior: Behavior normal.        Thought Content: Thought content normal.      Assessment:  ***   Plan: ***    Thank you for allowing [place] to participate in the care of Roswell Nickel, DNP, AGNP-C Adult & Gerontological Nurse Practitioner [facility]   06/12/2020 1:29 PM   Disclaimer: This note was dictated with voice recognition software. Similar sounding words can inadvertently be transcribed and may not be corrected upon review.

## 2020-07-02 ENCOUNTER — Telehealth: Payer: Self-pay

## 2020-07-02 NOTE — Telephone Encounter (Signed)
Pt called back to schedule his EGD. Pt wants you to contact his number to schedule.

## 2020-07-02 NOTE — Telephone Encounter (Signed)
See CT abdomen result note. Pt needs OV prior to EGD and can hold spot for EGD.  Called pt, spot held for EGD 08/15/20. OV 08/10/20 at 10:30am.

## 2020-08-10 ENCOUNTER — Ambulatory Visit: Payer: Medicare Other | Admitting: Gastroenterology

## 2020-08-10 ENCOUNTER — Other Ambulatory Visit: Payer: Self-pay

## 2020-08-10 ENCOUNTER — Encounter: Payer: Self-pay | Admitting: *Deleted

## 2020-08-10 ENCOUNTER — Encounter: Payer: Self-pay | Admitting: Gastroenterology

## 2020-08-10 ENCOUNTER — Telehealth: Payer: Self-pay | Admitting: *Deleted

## 2020-08-10 DIAGNOSIS — R1012 Left upper quadrant pain: Secondary | ICD-10-CM

## 2020-08-10 DIAGNOSIS — R933 Abnormal findings on diagnostic imaging of other parts of digestive tract: Secondary | ICD-10-CM

## 2020-08-10 NOTE — Progress Notes (Signed)
Primary Care Physician:  Monico Blitz, MD  Primary Gastroenterologist:  Garfield Cornea, MD   Chief Complaint  Patient presents with  . Abdominal Pain    Hurting left upper abd past few days. Holding spot for EGD 4/27    HPI:  Ethan Fields is a 65 y.o. male here for follow-up of abnormal CT scan.  Last seen November 2021.  Patient has a history of hospitalization in September 2021 for abdominal pain associated with nausea and vomiting.  Lipase is moderately elevated at that time.  CT with benign appearing pancreas.  LFTs normal.  Clinically and biochemically appear that he had pancreatitis.  He underwent CT abdomen pancreatic protocol to better evaluate his pancreas.  He was noted to have prominent/protruding soft tissue lesion into the duodenum near the pancreatic head, which may be a prominent ampulla but EGD recommended to further evaluate.  No evidence of pancreatic mass, ductal dilation, acute inflammation.  Patient presents today overall doing well.  States he has had a cold and some coughing for a few days.  Denies known exposure to COVID.  Has noted some mild left upper quadrant discomfort for couple of days, worse with coughing.  Aggravating more than anything.  Not severe like when he had pancreatitis.  No nausea or vomiting.  No heartburn.  Bowel movements are good.  No melena or rectal bleeding.  Colonoscopy at [facility] 2012.  Current Outpatient Medications  Medication Sig Dispense Refill  . amLODipine (NORVASC) 5 MG tablet Take 5 mg by mouth daily.  6  . atorvastatin (LIPITOR) 20 MG tablet Take 20 mg by mouth daily.  7  . carvedilol (COREG) 6.25 MG tablet Take 6.25 mg by mouth 2 (two) times daily with a meal.    . clopidogrel (PLAVIX) 75 MG tablet Take 75 mg by mouth daily.    . hydrochlorothiazide (HYDRODIURIL) 25 MG tablet Take 25 mg by mouth daily.     No current facility-administered medications for this visit.    Allergies as of 08/10/2020 - Review Complete 08/10/2020   Allergen Reaction Noted  . Lisinopril Swelling 03/20/2020  . No known allergies  11/04/2016    Past Medical History:  Diagnosis Date  . CAD (coronary artery disease)   . Hyperlipidemia   . Hypertension   . Myocardial infarct ([place]) 2010  . Stroke [facility]) 2011   TIA, 10/2016- Stroke - right side weakness    Past Surgical History:  Procedure Laterality Date  . CAROTID ENDARTERECTOMY Left 11/05/2016  . COLONOSCOPY  2012   Report not available, pathology showed  . ENDARTERECTOMY Left 11/05/2016   Procedure: ENDARTERECTOMY CAROTID-LEFT;  Surgeon: Rosetta Posner, MD;  Location: [place];  Service: Vascular;  Laterality: Left;  . PATCH ANGIOPLASTY Left 11/05/2016   Procedure: PATCH ANGIOPLASTY;  Surgeon: Rosetta Posner, MD;  Location: [facility] OR;  Service: Vascular;  Laterality: Left;    Family History  Problem Relation Age of Onset  . Colon cancer Neg Hx   . Pancreatic disease Neg Hx     Social History   Socioeconomic History  . Marital status: Single    Spouse name: Not on file  . Number of children: Not on file  . Years of education: Not on file  . Highest education level: Not on file  Occupational History  . Not on file  Tobacco Use  . Smoking status: Former Smoker    Years: 35.00    Types: Cigarettes    Quit date: 2010  Years since quitting: 12.3  . Smokeless tobacco: Never Used  Vaping Use  . Vaping Use: Never used  Substance and Sexual Activity  . Alcohol use: Not Currently    Comment: no etoh in over 25 years. occasional; denied 08/10/20  . Drug use: Yes    Types: Marijuana  . Sexual activity: Not on file  Other Topics Concern  . Not on file  Social History Narrative  . Not on file   Social Determinants of Health   Financial Resource Strain: Not on file  Food Insecurity: Not on file  Transportation Needs: Not on file  Physical Activity: Not on file  Stress: Not on file  Social Connections: Not on file  Intimate Partner Violence: Not on file       ROS:  General: Negative for anorexia, weight loss, fever, chills, fatigue, weakness. Eyes: Negative for vision changes.  ENT: Negative for hoarseness, difficulty swallowing , nasal congestion. CV: Negative for chest pain, angina, palpitations, dyspnea on exertion, peripheral edema.  Respiratory: Negative for dyspnea at rest, dyspnea on exertion, positive cough, sputum, wheezing.  GI: See history of present illness. GU:  Negative for dysuria, hematuria, urinary incontinence, urinary frequency, nocturnal urination.  MS: Negative for joint pain, low back pain.  Derm: Negative for rash or itching.  Neuro: Negative for weakness, abnormal sensation, seizure, frequent headaches, memory loss, confusion.  Psych: Negative for anxiety, depression, suicidal ideation, hallucinations.  Endo: Negative for unusual weight change.  Heme: Negative for bruising or bleeding. Allergy: Negative for rash or hives.    Physical Examination:  BP (!) 144/92   Pulse 78   Temp (!) 97.1 F (36.2 C) (Temporal)   Ht 5\' 11"  (1.803 m)   Wt 176 lb 9.6 oz (80.1 kg)   BMI 24.63 kg/m    General: Well-nourished, well-developed in no acute distress.  Head: Normocephalic, atraumatic.   Eyes: Conjunctiva pink, no icterus. Mouth: masked Neck: Supple without thyromegaly, masses, or lymphadenopathy.  Lungs: Clear to auscultation bilaterally.  Heart: Regular rate and rhythm, no murmurs rubs or gallops.  Abdomen: Bowel sounds are normal, nontender, nondistended, no hepatosplenomegaly or masses, no abdominal bruits or    hernia , no rebound or guarding.   Rectal: not performed Extremities: No lower extremity edema. No clubbing or deformities.  Neuro: Alert and oriented x 4 , grossly normal neurologically.  Skin: Warm and dry, no rash or jaundice.   Psych: Alert and cooperative, normal mood and affect.  Labs: Lab Results  Component Value Date   CREATININE 1.10 06/07/2020   BUN 21 05/02/2020   NA 140 05/02/2020   K  4.3 05/02/2020   CL 103 05/02/2020   CO2 29 05/02/2020   Lab Results  Component Value Date   ALT 37 05/02/2020   AST 29 05/02/2020   ALKPHOS 78 10/28/2016   BILITOT 0.6 05/02/2020   Lab Results  Component Value Date   LIPASE 97 (H) 05/02/2020   Lab Results  Component Value Date   WBC 3.8 05/02/2020   HGB 15.9 05/02/2020   HCT 43.9 05/02/2020   MCV 94.8 05/02/2020   PLT 245 05/02/2020     Imaging Studies: No results found.  Assessment:  Pleasant 65 year old male with history of pancreatitis as outlined above, presenting for follow-up of abnormal CT scan.  He was noted to have prominent/protruding soft tissue lesion into the duodenum near the pancreatic head, which may be a prominent ampulla but EGD recommended to further evaluate.  Patient denies any recurrent  abdominal pain like before when he was hospitalized.  He does have left upper quadrant for 2 days possibly related to coughing/rectal.  Colonoscopy in 2012, due at this time.  Plan:  1. Upper endoscopy in the near future with Dr. Gala Romney.  Plan for conscious sedation. ASA II/III.  I have discussed the risks, alternatives, benefits with regards to but not limited to the risk of reaction to medication, bleeding, infection, perforation and the patient is agreeable to proceed. Written consent to be obtained. 2. If persistent left upper quadrant pain, no explanation seen on EGD, will require further evaluation.  Suspect musculoskeletal component.  I have advised patient to watch for development of rash/shingles.  He will call PCP or [place] that develops. 3. Would recommend colonoscopy later this year.

## 2020-08-10 NOTE — Telephone Encounter (Signed)
PA approved via [facility] website. Authorization #: [id number] Requested Dates: Aug 15, 2020 - Nov 13, 2020

## 2020-08-10 NOTE — H&P (View-Only) (Signed)
Primary Care Physician:  Monico Blitz, MD  Primary Gastroenterologist:  Garfield Cornea, MD   Chief Complaint  Patient presents with  . Abdominal Pain    Hurting left upper abd past few days. Holding spot for EGD 4/27    HPI:  Ethan Fields is a 65 y.o. male here for follow-up of abnormal CT scan.  Last seen November 2021.  Patient has a history of hospitalization in September 2021 for abdominal pain associated with nausea and vomiting.  Lipase is moderately elevated at that time.  CT with benign appearing pancreas.  LFTs normal.  Clinically and biochemically appear that he had pancreatitis.  He underwent CT abdomen pancreatic protocol to better evaluate his pancreas.  He was noted to have prominent/protruding soft tissue lesion into the duodenum near the pancreatic head, which may be a prominent ampulla but EGD recommended to further evaluate.  No evidence of pancreatic mass, ductal dilation, acute inflammation.  Patient presents today overall doing well.  States he has had a cold and some coughing for a few days.  Denies known exposure to COVID.  Has noted some mild left upper quadrant discomfort for couple of days, worse with coughing.  Aggravating more than anything.  Not severe like when he had pancreatitis.  No nausea or vomiting.  No heartburn.  Bowel movements are good.  No melena or rectal bleeding.  Colonoscopy at [facility] 2012.  Current Outpatient Medications  Medication Sig Dispense Refill  . amLODipine (NORVASC) 5 MG tablet Take 5 mg by mouth daily.  6  . atorvastatin (LIPITOR) 20 MG tablet Take 20 mg by mouth daily.  7  . carvedilol (COREG) 6.25 MG tablet Take 6.25 mg by mouth 2 (two) times daily with a meal.    . clopidogrel (PLAVIX) 75 MG tablet Take 75 mg by mouth daily.    . hydrochlorothiazide (HYDRODIURIL) 25 MG tablet Take 25 mg by mouth daily.     No current facility-administered medications for this visit.    Allergies as of 08/10/2020 - Review Complete 08/10/2020   Allergen Reaction Noted  . Lisinopril Swelling 03/20/2020  . No known allergies  11/04/2016    Past Medical History:  Diagnosis Date  . CAD (coronary artery disease)   . Hyperlipidemia   . Hypertension   . Myocardial infarct ([place]) 2010  . Stroke [facility]) 2011   TIA, 10/2016- Stroke - right side weakness    Past Surgical History:  Procedure Laterality Date  . CAROTID ENDARTERECTOMY Left 11/05/2016  . COLONOSCOPY  2012   Report not available, pathology showed  . ENDARTERECTOMY Left 11/05/2016   Procedure: ENDARTERECTOMY CAROTID-LEFT;  Surgeon: Rosetta Posner, MD;  Location: [place];  Service: Vascular;  Laterality: Left;  . PATCH ANGIOPLASTY Left 11/05/2016   Procedure: PATCH ANGIOPLASTY;  Surgeon: Rosetta Posner, MD;  Location: [facility] OR;  Service: Vascular;  Laterality: Left;    Family History  Problem Relation Age of Onset  . Colon cancer Neg Hx   . Pancreatic disease Neg Hx     Social History   Socioeconomic History  . Marital status: Single    Spouse name: Not on file  . Number of children: Not on file  . Years of education: Not on file  . Highest education level: Not on file  Occupational History  . Not on file  Tobacco Use  . Smoking status: Former Smoker    Years: 35.00    Types: Cigarettes    Quit date: 2010  Years since quitting: 12.3  . Smokeless tobacco: Never Used  Vaping Use  . Vaping Use: Never used  Substance and Sexual Activity  . Alcohol use: Not Currently    Comment: no etoh in over 25 years. occasional; denied 08/10/20  . Drug use: Yes    Types: Marijuana  . Sexual activity: Not on file  Other Topics Concern  . Not on file  Social History Narrative  . Not on file   Social Determinants of Health   Financial Resource Strain: Not on file  Food Insecurity: Not on file  Transportation Needs: Not on file  Physical Activity: Not on file  Stress: Not on file  Social Connections: Not on file  Intimate Partner Violence: Not on file       ROS:  General: Negative for anorexia, weight loss, fever, chills, fatigue, weakness. Eyes: Negative for vision changes.  ENT: Negative for hoarseness, difficulty swallowing , nasal congestion. CV: Negative for chest pain, angina, palpitations, dyspnea on exertion, peripheral edema.  Respiratory: Negative for dyspnea at rest, dyspnea on exertion, positive cough, sputum, wheezing.  GI: See history of present illness. GU:  Negative for dysuria, hematuria, urinary incontinence, urinary frequency, nocturnal urination.  MS: Negative for joint pain, low back pain.  Derm: Negative for rash or itching.  Neuro: Negative for weakness, abnormal sensation, seizure, frequent headaches, memory loss, confusion.  Psych: Negative for anxiety, depression, suicidal ideation, hallucinations.  Endo: Negative for unusual weight change.  Heme: Negative for bruising or bleeding. Allergy: Negative for rash or hives.    Physical Examination:  BP (!) 144/92   Pulse 78   Temp (!) 97.1 F (36.2 C) (Temporal)   Ht 5\' 11"  (1.803 m)   Wt 176 lb 9.6 oz (80.1 kg)   BMI 24.63 kg/m    General: Well-nourished, well-developed in no acute distress.  Head: Normocephalic, atraumatic.   Eyes: Conjunctiva pink, no icterus. Mouth: masked Neck: Supple without thyromegaly, masses, or lymphadenopathy.  Lungs: Clear to auscultation bilaterally.  Heart: Regular rate and rhythm, no murmurs rubs or gallops.  Abdomen: Bowel sounds are normal, nontender, nondistended, no hepatosplenomegaly or masses, no abdominal bruits or    hernia , no rebound or guarding.   Rectal: not performed Extremities: No lower extremity edema. No clubbing or deformities.  Neuro: Alert and oriented x 4 , grossly normal neurologically.  Skin: Warm and dry, no rash or jaundice.   Psych: Alert and cooperative, normal mood and affect.  Labs: Lab Results  Component Value Date   CREATININE 1.10 06/07/2020   BUN 21 05/02/2020   NA 140 05/02/2020   K  4.3 05/02/2020   CL 103 05/02/2020   CO2 29 05/02/2020   Lab Results  Component Value Date   ALT 37 05/02/2020   AST 29 05/02/2020   ALKPHOS 78 10/28/2016   BILITOT 0.6 05/02/2020   Lab Results  Component Value Date   LIPASE 97 (H) 05/02/2020   Lab Results  Component Value Date   WBC 3.8 05/02/2020   HGB 15.9 05/02/2020   HCT 43.9 05/02/2020   MCV 94.8 05/02/2020   PLT 245 05/02/2020     Imaging Studies: No results found.  Assessment:  Pleasant 65 year old male with history of pancreatitis as outlined above, presenting for follow-up of abnormal CT scan.  He was noted to have prominent/protruding soft tissue lesion into the duodenum near the pancreatic head, which may be a prominent ampulla but EGD recommended to further evaluate.  Patient denies any recurrent  abdominal pain like before when he was hospitalized.  He does have left upper quadrant for 2 days possibly related to coughing/rectal.  Colonoscopy in 2012, due at this time.  Plan:  1. Upper endoscopy in the near future with Dr. Gala Romney.  Plan for conscious sedation. ASA II/III.  I have discussed the risks, alternatives, benefits with regards to but not limited to the risk of reaction to medication, bleeding, infection, perforation and the patient is agreeable to proceed. Written consent to be obtained. 2. If persistent left upper quadrant pain, no explanation seen on EGD, will require further evaluation.  Suspect musculoskeletal component.  I have advised patient to watch for development of rash/shingles.  He will call PCP or [place] that develops. 3. Would recommend colonoscopy later this year.

## 2020-08-10 NOTE — Patient Instructions (Addendum)
1. Upper endoscopy as scheduled.  Please see separate instructions. 2. Monitor your abdominal pain, if worse with food, cut back to liquid diet for couple of days.  3. Your abdominal pain may be secondary to muscle strain from coughing. Monitor for a rash at location of your abdominal pain, if you see a rash contact your provider. Shingles can cause rash and pain and should be treated.

## 2020-08-14 ENCOUNTER — Other Ambulatory Visit (HOSPITAL_COMMUNITY)
Admission: RE | Admit: 2020-08-14 | Discharge: 2020-08-14 | Disposition: A | Discharge status: Home / Self Care | Payer: Medicare Other | Source: Ambulatory Visit | Attending: Internal Medicine | Admitting: Internal Medicine

## 2020-08-14 ENCOUNTER — Other Ambulatory Visit: Payer: Self-pay

## 2020-08-14 DIAGNOSIS — Z20822 Contact with and (suspected) exposure to covid-19: Secondary | ICD-10-CM | POA: Insufficient documentation

## 2020-08-14 DIAGNOSIS — Z01812 Encounter for preprocedural laboratory examination: Secondary | ICD-10-CM | POA: Diagnosis not present

## 2020-08-14 NOTE — OR Nursing (Signed)
Spoke with patient & advised to check into ShortStay at 9:15 on 08/15/20 for EGD.

## 2020-08-14 NOTE — Progress Notes (Signed)
CC'ED TO PCP 

## 2020-08-15 ENCOUNTER — Encounter (HOSPITAL_COMMUNITY)
Admission: RE | Disposition: A | Discharge status: Home / Self Care | Payer: Self-pay | Source: Home / Self Care | Attending: Internal Medicine

## 2020-08-15 ENCOUNTER — Ambulatory Visit (HOSPITAL_COMMUNITY)
Admission: RE | Admit: 2020-08-15 | Discharge: 2020-08-15 | Disposition: A | Discharge status: Home / Self Care | Payer: Medicare Other | Attending: Internal Medicine | Admitting: Internal Medicine

## 2020-08-15 ENCOUNTER — Other Ambulatory Visit: Payer: Self-pay

## 2020-08-15 ENCOUNTER — Encounter (HOSPITAL_COMMUNITY): Payer: Self-pay | Admitting: Internal Medicine

## 2020-08-15 DIAGNOSIS — I252 Old myocardial infarction: Secondary | ICD-10-CM | POA: Diagnosis not present

## 2020-08-15 DIAGNOSIS — Z87891 Personal history of nicotine dependence: Secondary | ICD-10-CM | POA: Diagnosis not present

## 2020-08-15 DIAGNOSIS — Z79899 Other long term (current) drug therapy: Secondary | ICD-10-CM | POA: Insufficient documentation

## 2020-08-15 DIAGNOSIS — E785 Hyperlipidemia, unspecified: Secondary | ICD-10-CM | POA: Insufficient documentation

## 2020-08-15 DIAGNOSIS — Z7902 Long term (current) use of antithrombotics/antiplatelets: Secondary | ICD-10-CM | POA: Insufficient documentation

## 2020-08-15 DIAGNOSIS — R933 Abnormal findings on diagnostic imaging of other parts of digestive tract: Secondary | ICD-10-CM | POA: Diagnosis not present

## 2020-08-15 DIAGNOSIS — Z888 Allergy status to other drugs, medicaments and biological substances status: Secondary | ICD-10-CM | POA: Insufficient documentation

## 2020-08-15 DIAGNOSIS — I2510 Atherosclerotic heart disease of native coronary artery without angina pectoris: Secondary | ICD-10-CM | POA: Insufficient documentation

## 2020-08-15 DIAGNOSIS — I10 Essential (primary) hypertension: Secondary | ICD-10-CM | POA: Diagnosis not present

## 2020-08-15 DIAGNOSIS — Z8673 Personal history of transient ischemic attack (TIA), and cerebral infarction without residual deficits: Secondary | ICD-10-CM | POA: Insufficient documentation

## 2020-08-15 HISTORY — PX: ESOPHAGOGASTRODUODENOSCOPY: SHX5428

## 2020-08-15 LAB — SARS CORONAVIRUS 2 (TAT 6-24 HRS): SARS Coronavirus 2: NEGATIVE

## 2020-08-15 SURGERY — EGD (ESOPHAGOGASTRODUODENOSCOPY)
Anesthesia: Moderate Sedation

## 2020-08-15 MED ORDER — STERILE WATER FOR IRRIGATION IR SOLN
Status: DC | PRN
  Administered 2020-08-15: 1.5 mL

## 2020-08-15 MED ORDER — MEPERIDINE HCL 100 MG/ML IJ SOLN
INTRAMUSCULAR | Status: DC | PRN
  Administered 2020-08-15: 15 mg via INTRAVENOUS
  Administered 2020-08-15: 25 mg via INTRAVENOUS

## 2020-08-15 MED ORDER — SODIUM CHLORIDE 0.9 % IV SOLN
INTRAVENOUS | Status: DC
  Administered 2020-08-15: 1000 mL via INTRAVENOUS

## 2020-08-15 MED ORDER — ONDANSETRON HCL 4 MG/2ML IJ SOLN
INTRAMUSCULAR | Status: AC
  Filled 2020-08-15: qty 2

## 2020-08-15 MED ORDER — ONDANSETRON HCL 4 MG/2ML IJ SOLN
INTRAMUSCULAR | Status: DC | PRN
  Administered 2020-08-15: 4 mg via INTRAVENOUS

## 2020-08-15 MED ORDER — LIDOCAINE VISCOUS HCL 2 % MT SOLN
OROMUCOSAL | Status: AC
  Filled 2020-08-15: qty 15

## 2020-08-15 MED ORDER — MIDAZOLAM HCL 5 MG/5ML IJ SOLN
INTRAMUSCULAR | Status: AC
  Filled 2020-08-15: qty 10

## 2020-08-15 MED ORDER — MIDAZOLAM HCL 5 MG/5ML IJ SOLN
INTRAMUSCULAR | Status: DC | PRN
  Administered 2020-08-15: 2 mg via INTRAVENOUS
  Administered 2020-08-15 (×3): 1 mg via INTRAVENOUS

## 2020-08-15 MED ORDER — MEPERIDINE HCL 50 MG/ML IJ SOLN
INTRAMUSCULAR | Status: AC
  Filled 2020-08-15: qty 1

## 2020-08-15 NOTE — Interval H&P Note (Signed)
History and Physical Interval Note:  08/15/2020 10:12 AM  Ethan Fields  has presented today for surgery, with the diagnosis of abn duodenum on CT.  The various methods of treatment have been discussed with the patient and family. After consideration of risks, benefits and other options for treatment, the patient has consented to  Procedure(s) with comments: ESOPHAGOGASTRODUODENOSCOPY (EGD) (N/A) - 2:30pm as a surgical intervention.  The patient's history has been reviewed, patient examined, no change in status, stable for surgery.  I have reviewed the patient's chart and labs.  Questions were answered to the patient's satisfaction.     Ethan Fields  No change.  Feeling good.  No dysphagia.  Diagnostic EGD per plan. The risks, benefits, limitations, alternatives and imponderables have been reviewed with the patient. Potential for esophageal dilation, biopsy, etc. have also been reviewed.  Questions have been answered. All parties agreeable.

## 2020-08-15 NOTE — Discharge Instructions (Signed)
EGD Discharge instructions Please read the instructions outlined below and refer to this sheet in the next few weeks. These discharge instructions provide you with general information on caring for yourself after you leave the hospital. Your doctor may also give you specific instructions. While your treatment has been planned according to the most current medical practices available, unavoidable complications occasionally occur. If you have any problems or questions after discharge, please call your doctor. ACTIVITY  You may resume your regular activity but move at a slower pace for the next 24 hours.   Take frequent rest periods for the next 24 hours.   Walking will help expel (get rid of) the air and reduce the bloated feeling in your abdomen.   No driving for 24 hours (because of the anesthesia (medicine) used during the test).   You may shower.   Do not sign any important legal documents or operate any machinery for 24 hours (because of the anesthesia used during the test).  NUTRITION  Drink plenty of fluids.   You may resume your normal diet.   Begin with a light meal and progress to your normal diet.   Avoid alcoholic beverages for 24 hours or as instructed by your caregiver.  MEDICATIONS  You may resume your normal medications unless your caregiver tells you otherwise.  WHAT YOU CAN EXPECT TODAY  You may experience abdominal discomfort such as a feeling of fullness or "gas" pains.  FOLLOW-UP  Your doctor will discuss the results of your test with you.  SEEK IMMEDIATE MEDICAL ATTENTION IF ANY OF THE FOLLOWING OCCUR:  Excessive nausea (feeling sick to your stomach) and/or vomiting.   Severe abdominal pain and distention (swelling).   Trouble swallowing.   Temperature over 101 F (37.8 C).   Rectal bleeding or vomiting of blood.   Your examination was normal today  Please call us if you are experience any further GI symptoms  At patient request, I called [place]  at [phone] -rolled to voicemail.  "Mailbox full"

## 2020-08-15 NOTE — Op Note (Signed)
[facility] Patient Name: Ethan Fields Procedure Date: 08/15/2020 10:07 AM MRN: [id number] Date of Birth: 08-Dec-1955 Attending MD: Norvel Richards , MD CSN: [id number] Age: 65 Admit Type: Outpatient Procedure:                Upper GI endoscopy Indications:              Abnormal CT of the GI tract Providers:                Norvel Richards, MD, Otis Peak B. Sharon Seller, RN,                            Raphael Gibney, Technician Referring MD:              Medicines:                Midazolam 5 mg IV, Meperidine 40 mg IV Complications:            No immediate complications. Estimated Blood Loss:     Estimated blood loss: none. Procedure:                Pre-Anesthesia Assessment:                           - Prior to the procedure, a History and Physical                            was performed, and patient medications and                            allergies were reviewed. The patient's tolerance of                            previous anesthesia was also reviewed. The risks                            and benefits of the procedure and the sedation                            options and risks were discussed with the patient.                            All questions were answered, and informed consent                            was obtained. Prior Anticoagulants: The patient                            last took Plavix (clopidogrel) on the day of the                            procedure. ASA Grade Assessment: III - A patient                            with severe systemic disease. After reviewing the  risks and benefits, the patient was deemed in                            satisfactory condition to undergo the procedure.                           After obtaining informed consent, the endoscope was                            passed under direct vision. Throughout the                            procedure, the patient's blood pressure, pulse, and                             oxygen saturations were monitored continuously. The                            GIF-H190 ([id number]) scope was introduced through the                            mouth, and advanced to the third part of duodenum.                            The upper GI endoscopy was accomplished without                            difficulty. The patient tolerated the procedure                            well. Scope In: 10:27:34 AM Scope Out: 10:33:59 AM Total Procedure Duration: 0 hours 6 minutes 25 seconds  Findings:      The examined esophagus was normal.      The entire examined stomach was normal.      The second portion of the duodenum and third portion of the duodenum       were normal. Fairly good glancing views of the ampulla revealed no       abnormality. Impression:               - Normal esophagus.                           - Normal stomach.                           - Normal second portion of the duodenum and third                            portion of the duodenum.                           - No specimens collected. It may well be that                            recent duodenal abnormality was  secondary to "focal                            pancreatitis" although nonspecific inflammation or                            even artifact not excluded. At any rate, patient is                            now asymptomatic and today's findings are                            reassuring. Moderate Sedation:      Moderate (conscious) sedation was administered by the endoscopy nurse       and supervised by the endoscopist. The following parameters were       monitored: oxygen saturation, heart rate, blood pressure, respiratory       rate, EKG, adequacy of pulmonary ventilation, and response to care.       Total physician intraservice time was 14 minutes. Recommendation:           - Patient has a contact number available for                            emergencies. The signs and symptoms of potential                             delayed complications were discussed with the                            patient. Return to normal activities tomorrow.                            Written discharge instructions were provided to the                            patient.                           - Resume previous diet.                           - Continue present medications.                           - Return to my office (date not yet determined).                            Patient is to notify us if he has any recurrent GI                            symptoms. Procedure Code(s):        --- Professional ---                           [phone], Esophagogastroduodenoscopy, flexible,  transoral; diagnostic, including collection of                            specimen(s) by brushing or washing, when performed                            (separate procedure)                           G0500, Moderate sedation services provided by the                            same physician or other qualified health care                            professional performing a gastrointestinal                            endoscopic service that sedation supports,                            requiring the presence of an independent trained                            observer to assist in the monitoring of the                            patient's level of consciousness and physiological                            status; initial 15 minutes of intra-service time;                            patient age 58 years or older (additional time may                            be reported with [phone], as appropriate) Diagnosis Code(s):        --- Professional ---                           R93.3, Abnormal findings on diagnostic imaging of                            other parts of digestive tract CPT copyright 2019 [employer]. All rights reserved. The codes documented in this report are preliminary and  upon coder review may  be revised to meet current compliance requirements. Cristopher Estimable. Jacquetta Polhamus, MD Norvel Richards, MD 08/15/2020 10:43:47 AM This report has been signed electronically. Number of Addenda: 0

## 2020-08-17 ENCOUNTER — Encounter (HOSPITAL_COMMUNITY): Payer: Self-pay | Admitting: Internal Medicine

## 2020-10-19 ENCOUNTER — Telehealth: Payer: Self-pay | Admitting: Gastroenterology

## 2020-10-19 NOTE — Telephone Encounter (Signed)
Following up on EGD findings, reassuring.   Would offer patient follow up with any APP schedule 10 year follow up colonoscopy.

## 2020-10-23 ENCOUNTER — Encounter: Payer: Self-pay | Admitting: Internal Medicine

## 2020-10-30 DIAGNOSIS — Z299 Encounter for prophylactic measures, unspecified: Secondary | ICD-10-CM | POA: Diagnosis not present

## 2020-10-30 DIAGNOSIS — Z0000 Encounter for general adult medical examination without abnormal findings: Secondary | ICD-10-CM | POA: Diagnosis not present

## 2020-10-30 DIAGNOSIS — Z6824 Body mass index (BMI) 24.0-24.9, adult: Secondary | ICD-10-CM | POA: Diagnosis not present

## 2020-10-30 DIAGNOSIS — I10 Essential (primary) hypertension: Secondary | ICD-10-CM | POA: Diagnosis not present

## 2020-10-30 DIAGNOSIS — Z125 Encounter for screening for malignant neoplasm of prostate: Secondary | ICD-10-CM | POA: Diagnosis not present

## 2020-10-30 DIAGNOSIS — Z1211 Encounter for screening for malignant neoplasm of colon: Secondary | ICD-10-CM | POA: Diagnosis not present

## 2020-10-30 DIAGNOSIS — E7800 Pure hypercholesterolemia, unspecified: Secondary | ICD-10-CM | POA: Diagnosis not present

## 2020-10-30 DIAGNOSIS — Z1331 Encounter for screening for depression: Secondary | ICD-10-CM | POA: Diagnosis not present

## 2020-10-30 DIAGNOSIS — Z1339 Encounter for screening examination for other mental health and behavioral disorders: Secondary | ICD-10-CM | POA: Diagnosis not present

## 2020-10-30 DIAGNOSIS — Z7189 Other specified counseling: Secondary | ICD-10-CM | POA: Diagnosis not present

## 2020-10-30 DIAGNOSIS — Z79899 Other long term (current) drug therapy: Secondary | ICD-10-CM | POA: Diagnosis not present

## 2020-10-30 DIAGNOSIS — I69351 Hemiplegia and hemiparesis following cerebral infarction affecting right dominant side: Secondary | ICD-10-CM | POA: Diagnosis not present

## 2020-10-30 DIAGNOSIS — R5383 Other fatigue: Secondary | ICD-10-CM | POA: Diagnosis not present

## 2021-02-08 ENCOUNTER — Encounter: Payer: Self-pay | Admitting: Gastroenterology

## 2021-02-08 ENCOUNTER — Other Ambulatory Visit: Payer: Self-pay

## 2021-02-08 ENCOUNTER — Ambulatory Visit: Payer: Medicare HMO | Admitting: Gastroenterology

## 2021-02-08 VITALS — BP 110/78 | HR 67 | Temp 97.3°F | Ht 71.0 in | Wt 168.4 lb

## 2021-02-08 DIAGNOSIS — Z8719 Personal history of other diseases of the digestive system: Secondary | ICD-10-CM

## 2021-02-08 DIAGNOSIS — Z1211 Encounter for screening for malignant neoplasm of colon: Secondary | ICD-10-CM

## 2021-02-08 NOTE — Progress Notes (Signed)
Primary Care Physician: Monico Blitz, MD  Primary Gastroenterologist:  Garfield Cornea, MD   Chief Complaint  Patient presents with   Abdominal Pain    F/u, none lately    HPI: Ethan Fields is a 65 y.o. male here for follow-up.  He was seen in the office back in April for abnormal CT scan.  History of hospitalization in September 2021 with abdominal pain/vomiting.  Lipase moderately elevated at that time.  CT with benign appearing pancreas.  LFTs were normal.  Clinically and biochemically appear to have pancreatitis.  He completed CT abdomen pancreatic protocol which showed prominent/protruding soft tissue lesion in the duodenum near the pancreatic head, may be prominent ampulla, EGD recommended.  He completed EGD in April 2022 with unremarkable findings and it was felt that the duodenal abnormality could have been secondary to focal pancreatitis although nonspecific inflammation or even artifact not excluded.  Clinically patient continues to do well.  His appetite is good.  His weight fluctuates between 165 and 175 pounds.  Has mild intermittent left upper quadrant discomfort which lasts only for minutes at a time.  Typically can rub away.  No vomiting.  Bowel movements are regular.  No blood in stool or melena.  Last colonoscopy in 2012, colonoscopy report not available pathology report available showing "prominent lymphoid nodule" removed from mid transverse colon  EGD April 2022: - Normal esophagus. - Normal stomach. - Normal second portion of the duodenum and third portion of the duodenum. - No specimens collected. It may well be that recent duodenal abnormality was secondary to "focal pancreatitis" although nonspecific inflammation or even artifact not excluded. At any rate, patient is now asymptomatic and today's findings are reassuring.    Current Outpatient Medications  Medication Sig Dispense Refill   amLODipine (NORVASC) 5 MG tablet Take 5 mg by mouth daily.  6    atorvastatin (LIPITOR) 20 MG tablet Take 20 mg by mouth daily.  7   carvedilol (COREG) 6.25 MG tablet Take 6.25 mg by mouth 2 (two) times daily with a meal.     clopidogrel (PLAVIX) 75 MG tablet Take 75 mg by mouth daily.     hydrochlorothiazide (HYDRODIURIL) 25 MG tablet Take 25 mg by mouth daily.     No current facility-administered medications for this visit.    Allergies as of 02/08/2021 - Review Complete 02/08/2021  Allergen Reaction Noted   Lisinopril Swelling 03/20/2020   Past Medical History:  Diagnosis Date   CAD (coronary artery disease)    Hyperlipidemia    Hypertension    Myocardial infarct ([place]) 2010   Stroke [facility]) 2011   TIA, 10/2016- Stroke - right side weakness   Past Surgical History:  Procedure Laterality Date   CAROTID ENDARTERECTOMY Left 11/05/2016   COLONOSCOPY  2012   Report not available, pathology showed prominent lymphoid nodule/transverse colon   ENDARTERECTOMY Left 11/05/2016   Procedure: ENDARTERECTOMY CAROTID-LEFT;  Surgeon: Rosetta Posner, MD;  Location: [facility] OR;  Service: Vascular;  Laterality: Left;   ESOPHAGOGASTRODUODENOSCOPY N/A 08/15/2020   Procedure: ESOPHAGOGASTRODUODENOSCOPY (EGD);  Surgeon: Daneil Dolin, MD;  Location: [facility];  Service: Endoscopy;  Laterality: N/A;  2:30pm   PATCH ANGIOPLASTY Left 11/05/2016   Procedure: PATCH ANGIOPLASTY;  Surgeon: Rosetta Posner, MD;  Location: [facility] OR;  Service: Vascular;  Laterality: Left;   Family History  Problem Relation Age of Onset   Colon cancer Neg Hx    Pancreatic disease Neg Hx  Social History   Tobacco Use   Smoking status: Former    Years: 35.00    Types: Cigarettes    Quit date: 2010    Years since quitting: 12.8   Smokeless tobacco: Never  Vaping Use   Vaping Use: Never used  Substance Use Topics   Alcohol use: Yes    Comment: states drinks very little- beer or liquor   Drug use: Yes    Types: Marijuana    ROS:  General: Negative for anorexia, weight loss, fever,  chills, fatigue, weakness. ENT: Negative for hoarseness, difficulty swallowing , nasal congestion. CV: Negative for chest pain, angina, palpitations, dyspnea on exertion, peripheral edema.  Respiratory: Negative for dyspnea at rest, dyspnea on exertion, cough, sputum, wheezing.  GI: See history of present illness. GU:  Negative for dysuria, hematuria, urinary incontinence, urinary frequency, nocturnal urination.  Endo: Negative for unusual weight change.    Physical Examination:   BP 110/78   Pulse 67   Temp (!) 97.3 F (36.3 C) (Temporal)   Ht 5\' 11"  (1.803 m)   Wt 168 lb 6.4 oz (76.4 kg)   BMI 23.49 kg/m   General: Well-nourished, well-developed in no acute distress.  Eyes: No icterus. Mouth: masked Lungs: Clear to auscultation bilaterally.  Heart: Regular rate and rhythm, no murmurs rubs or gallops.  Abdomen: Bowel sounds are normal, nontender, nondistended, no hepatosplenomegaly or masses, no abdominal bruits or hernia , no rebound or guarding.   Extremities: No lower extremity edema. No clubbing or deformities. Neuro: Alert and oriented x 4   Skin: Warm and dry, no jaundice.   Psych: Alert and cooperative, normal mood and affect.  Labs:  Lab Results  Component Value Date   CREATININE 1.10 06/07/2020   BUN 21 05/02/2020   NA 140 05/02/2020   K 4.3 05/02/2020   CL 103 05/02/2020   CO2 29 05/02/2020   Lab Results  Component Value Date   ALT 37 05/02/2020   AST 29 05/02/2020   ALKPHOS 78 10/28/2016   BILITOT 0.6 05/02/2020   Lab Results  Component Value Date   WBC 3.8 05/02/2020   HGB 15.9 05/02/2020   HCT 43.9 05/02/2020   MCV 94.8 05/02/2020   PLT 245 05/02/2020     Imaging Studies: No results found.   Assessment:  65 year old male with history of pancreatitis as outlined above, abnormal CT scan with prominent/protruding soft tissue lesion into the duodenum near the pancreatic head, completed EGD with no significant findings.  Presenting for follow-up  today.  Vague left upper quadrant discomfort lasting for only minutes at a time, typically massages away the pain.  Suspect more of a musculoskeletal component.  Continue to monitor.  Call with any pain that persists, appetite or weight loss concerns.   He is due for colonoscopy at this time, last one completed in 2012.  Denies any GI symptoms.   Plan:  Colonoscopy with Dr. Gala Romney. With conscious sedation. ASA III.  I have discussed the risks, alternatives, benefits with regards to but not limited to the risk of reaction to medication, bleeding, infection, perforation and the patient is agreeable to proceed. Written consent to be obtained.

## 2021-02-08 NOTE — Patient Instructions (Signed)
Screening colonoscopy as scheduled. See separate instructions.  For abdominal pain: please call if you have persistent abdominal pain, weight loss, or appetite concerns.

## 2021-02-15 DIAGNOSIS — I69351 Hemiplegia and hemiparesis following cerebral infarction affecting right dominant side: Secondary | ICD-10-CM | POA: Diagnosis not present

## 2021-02-15 DIAGNOSIS — Z6823 Body mass index (BMI) 23.0-23.9, adult: Secondary | ICD-10-CM | POA: Diagnosis not present

## 2021-02-15 DIAGNOSIS — M79604 Pain in right leg: Secondary | ICD-10-CM | POA: Diagnosis not present

## 2021-02-15 DIAGNOSIS — I10 Essential (primary) hypertension: Secondary | ICD-10-CM | POA: Diagnosis not present

## 2021-02-15 DIAGNOSIS — Z2821 Immunization not carried out because of patient refusal: Secondary | ICD-10-CM | POA: Diagnosis not present

## 2021-02-15 DIAGNOSIS — Z299 Encounter for prophylactic measures, unspecified: Secondary | ICD-10-CM | POA: Diagnosis not present

## 2021-02-21 ENCOUNTER — Telehealth: Payer: Self-pay | Admitting: *Deleted

## 2021-02-21 NOTE — Telephone Encounter (Signed)
Called pt, LMOVM to call back to schedule TCS with Dr. Gala Romney, conscious sedation

## 2021-02-26 NOTE — Telephone Encounter (Signed)
Tried to call pt to schedule TCS, LMOVM for return call. °

## 2021-02-27 NOTE — Telephone Encounter (Signed)
Letter mailed

## 2021-03-08 MED ORDER — PEG 3350-KCL-NA BICARB-NACL 420 G PO SOLR
ORAL | 0 refills | Status: DC
Start: 2021-03-08 — End: 2023-01-31

## 2021-03-08 NOTE — Telephone Encounter (Signed)
Patient returned call. He has been scheduled for 12/21 at 9:30am. Aware will mail prep instructions and send rx to pharmacy

## 2021-03-08 NOTE — Addendum Note (Signed)
Addended by: Cheron Every on: 03/08/2021 11:04 AM   Modules accepted: Orders

## 2021-04-09 ENCOUNTER — Telehealth: Payer: Self-pay | Admitting: *Deleted

## 2021-04-09 NOTE — Telephone Encounter (Signed)
PA approved via [facility] website. Auth# [id number], DOS Apr 10, 2021 - Apr 20, 2021

## 2021-04-10 ENCOUNTER — Other Ambulatory Visit: Payer: Self-pay

## 2021-04-10 ENCOUNTER — Encounter (HOSPITAL_COMMUNITY): Payer: Self-pay | Admitting: Internal Medicine

## 2021-04-10 ENCOUNTER — Encounter (HOSPITAL_COMMUNITY)
Admission: RE | Disposition: A | Discharge status: Home / Self Care | Payer: Self-pay | Source: Ambulatory Visit | Attending: Internal Medicine

## 2021-04-10 ENCOUNTER — Ambulatory Visit (HOSPITAL_COMMUNITY)
Admission: RE | Admit: 2021-04-10 | Discharge: 2021-04-10 | Disposition: A | Discharge status: Home / Self Care | Payer: Medicare Other | Source: Ambulatory Visit | Attending: Internal Medicine | Admitting: Internal Medicine

## 2021-04-10 DIAGNOSIS — Z1211 Encounter for screening for malignant neoplasm of colon: Secondary | ICD-10-CM | POA: Diagnosis not present

## 2021-04-10 DIAGNOSIS — Z139 Encounter for screening, unspecified: Secondary | ICD-10-CM | POA: Diagnosis not present

## 2021-04-10 DIAGNOSIS — D122 Benign neoplasm of ascending colon: Secondary | ICD-10-CM | POA: Insufficient documentation

## 2021-04-10 DIAGNOSIS — K5730 Diverticulosis of large intestine without perforation or abscess without bleeding: Secondary | ICD-10-CM | POA: Insufficient documentation

## 2021-04-10 DIAGNOSIS — D124 Benign neoplasm of descending colon: Secondary | ICD-10-CM | POA: Diagnosis not present

## 2021-04-10 HISTORY — PX: POLYPECTOMY: SHX5525

## 2021-04-10 HISTORY — PX: HEMOSTASIS CLIP PLACEMENT: SHX6857

## 2021-04-10 HISTORY — PX: COLONOSCOPY: SHX5424

## 2021-04-10 SURGERY — COLONOSCOPY
Anesthesia: Moderate Sedation

## 2021-04-10 MED ORDER — MEPERIDINE HCL 50 MG/ML IJ SOLN
INTRAMUSCULAR | Status: AC
  Filled 2021-04-10: qty 1

## 2021-04-10 MED ORDER — ONDANSETRON HCL 4 MG/2ML IJ SOLN
INTRAMUSCULAR | Status: DC | PRN
  Administered 2021-04-10: 4 mg via INTRAVENOUS

## 2021-04-10 MED ORDER — ONDANSETRON HCL 4 MG/2ML IJ SOLN
INTRAMUSCULAR | Status: AC
  Filled 2021-04-10: qty 2

## 2021-04-10 MED ORDER — MEPERIDINE HCL 100 MG/ML IJ SOLN
INTRAMUSCULAR | Status: DC | PRN
  Administered 2021-04-10: 25 mg via INTRAVENOUS

## 2021-04-10 MED ORDER — MIDAZOLAM HCL 5 MG/5ML IJ SOLN
INTRAMUSCULAR | Status: DC | PRN
  Administered 2021-04-10: 2 mg via INTRAVENOUS
  Administered 2021-04-10: 1 mg via INTRAVENOUS

## 2021-04-10 MED ORDER — MIDAZOLAM HCL 5 MG/5ML IJ SOLN
INTRAMUSCULAR | Status: AC
  Filled 2021-04-10: qty 10

## 2021-04-10 MED ORDER — SODIUM CHLORIDE 0.9 % IV SOLN: INTRAVENOUS | Status: DC

## 2021-04-10 NOTE — H&P (Signed)
@LOGO @   Primary Care Physician:  Monico Blitz, MD Primary Gastroenterologist:  Dr. Gala Romney  Pre-Procedure History & Physical: HPI:  Ethan Fields is a 65 y.o. male is here for a screening colonoscopy.  Negative colonoscopy 10 years ago.  No bowel symptoms.  History of CAD, cerebrovascular disease history of CVA.  Status post endarterectomy.  Patient remains on Plavix for this procedure per plan.  Past Medical History:  Diagnosis Date   CAD (coronary artery disease)    Hyperlipidemia    Hypertension    Myocardial infarct ([place]) 2010   Stroke [facility]) 2011   TIA, 10/2016- Stroke - right side weakness    Past Surgical History:  Procedure Laterality Date   CAROTID ENDARTERECTOMY Left 11/05/2016   COLONOSCOPY  2012   Report not available, pathology showed prominent lymphoid nodule/transverse colon   ENDARTERECTOMY Left 11/05/2016   Procedure: ENDARTERECTOMY CAROTID-LEFT;  Surgeon: Rosetta Posner, MD;  Location: [facility] OR;  Service: Vascular;  Laterality: Left;   ESOPHAGOGASTRODUODENOSCOPY N/A 08/15/2020   Procedure: ESOPHAGOGASTRODUODENOSCOPY (EGD);  Surgeon: Daneil Dolin, MD;  Location: [facility];  Service: Endoscopy;  Laterality: N/A;  2:30pm   PATCH ANGIOPLASTY Left 11/05/2016   Procedure: PATCH ANGIOPLASTY;  Surgeon: Rosetta Posner, MD;  Location: [facility] OR;  Service: Vascular;  Laterality: Left;    Prior to Admission medications   Medication Sig Start Date End Date Taking? Authorizing Provider  amLODipine (NORVASC) 5 MG tablet Take 5 mg by mouth daily. 09/27/16  Yes [provider]  atorvastatin (LIPITOR) 20 MG tablet Take 20 mg by mouth daily. 10/04/16  Yes [provider]  carvedilol (COREG) 6.25 MG tablet Take 6.25 mg by mouth 2 (two) times daily with a meal.   Yes [provider]  clopidogrel (PLAVIX) 75 MG tablet Take 75 mg by mouth daily.   Yes [provider]  fluocinonide ointment (LIDEX) 3.66 % Apply 1 application topically daily.   Yes [provider]  hydrochlorothiazide (HYDRODIURIL) 25 MG tablet Take 25 mg by mouth daily. 03/14/20  Yes [provider]  polyethylene glycol-electrolytes (NULYTELY) 420 g solution As directed 03/08/21  Yes Hazelle Woollard, Cristopher Estimable, MD    Allergies as of 03/08/2021 - Review Complete 02/08/2021  Allergen Reaction Noted   Lisinopril Swelling 03/20/2020    Family History  Problem Relation Age of Onset   Colon cancer Neg Hx    Pancreatic disease Neg Hx     Social History   Socioeconomic History   Marital status: Single    Spouse name: Not on file   Number of children: Not on file   Years of education: Not on file   Highest education level: Not on file  Occupational History   Not on file  Tobacco Use   Smoking status: Former    Years: 35.00    Types: Cigarettes    Quit date: 2010    Years since quitting: 12.9   Smokeless tobacco: Never  Vaping Use   Vaping Use: Never used  Substance and Sexual Activity   Alcohol use: Yes    Comment: states drinks very little- beer or liquor   Drug use: Yes    Types: Marijuana   Sexual activity: Not on file  Other Topics Concern   Not on file  Social History Narrative   Not on file   Social Determinants of Health   Financial Resource Strain: Not on file  Food Insecurity: Not on file  Transportation Needs: Not on file  Physical Activity: Not on file  Stress: Not on file  Social Connections: Not on file  Intimate Partner Violence: Not on file    Review of Systems: See HPI, otherwise negative ROS  Physical Exam: BP 120/87    Temp 97.9 F (36.6 C) (Axillary)    Resp (!) 23    Ht 5\' 11"  (1.803 m)    Wt 74.8 kg    SpO2 96%    BMI 23.01 kg/m  General:   Alert,  Well-developed, well-nourished, pleasant and cooperative in NAD Lungs:  Clear throughout to auscultation.   No wheezes, crackles, or rhonchi. No acute distress. Heart:  Regular rate and rhythm; no murmurs, clicks, rubs,  or gallops. Abdomen:  Soft, nontender and  nondistended. No masses, hepatosplenomegaly or hernias noted. Normal bowel sounds, without guarding, and without rebound.    Impression/Plan: [name] is now here to undergo a screening colonoscopy.  Average risk screening examination.  Given his CAD and cerebrovascular disease with stroke,we have recommended he stay on Plavix without interruption.  If a significant lesion is found special approach may be needed to lessen the chances of any procedure related bleeding.  Risks, benefits, limitations, imponderables and alternatives regarding colonoscopy have been reviewed with the patient. Questions have been answered. All parties agreeable.     Notice:  This dictation was prepared with Dragon dictation along with smaller phrase technology. Any transcriptional errors that result from this process are unintentional and may not be corrected upon review.

## 2021-04-10 NOTE — Op Note (Signed)
[facility] Patient Name: Ethan Fields Procedure Date: 04/10/2021 9:07 AM MRN: [id number] Date of Birth: Jan 12, 1956 Attending MD: Norvel Richards , MD CSN: [id number] Age: 65 Admit Type: Outpatient Procedure:                Colonoscopy Indications:              Screening for colorectal malignant neoplasm Providers:                Norvel Richards, MD, Caprice Kluver, Raphael Gibney, Technician Referring MD:              Medicines:                Midazolam 3 mg IV, Meperidine 25 mg IV Complications:            No immediate complications. Estimated Blood Loss:     Estimated blood loss was minimal. Estimated blood                            loss was minimal. Procedure:                Pre-Anesthesia Assessment:                           - Prior to the procedure, a History and Physical                            was performed, and patient medications and                            allergies were reviewed. The patient's tolerance of                            previous anesthesia was also reviewed. The risks                            and benefits of the procedure and the sedation                            options and risks were discussed with the patient.                            All questions were answered, and informed consent                            was obtained. Prior Anticoagulants: The patient                            last took Plavix (clopidogrel) on the day of the                            procedure. ASA Grade Assessment: III - A patient  with severe systemic disease. After reviewing the                            risks and benefits, the patient was deemed in                            satisfactory condition to undergo the procedure.                           After obtaining informed consent, the colonoscope                            was passed under direct vision. Throughout the                             procedure, the patient's blood pressure, pulse, and                            oxygen saturations were monitored continuously. The                            [id number]) scope was introduced through the                            anus and advanced to the the cecum, identified by                            appendiceal orifice and ileocecal valve. The                            colonoscopy was performed without difficulty. The                            patient tolerated the procedure well. The quality                            of the bowel preparation was adequate. Scope In: 9:25:26 AM Scope Out: 9:52:12 AM Scope Withdrawal Time: 0 hours 15 minutes 28 seconds  Total Procedure Duration: 0 hours 26 minutes 46 seconds  Findings:      Two semi-pedunculated polyps were found in the descending colon and       ascending colon. The polyps were 2 to 5 mm in size. These polyps were       removed with a cold snare. Resection and retrieval were complete.       Estimated blood loss was minimal.      A 9 mm polyp was found in the ascending colon. The polyp was       semi-pedunculated. The polyp was removed with a hot snare. Resection and       retrieval were complete. Estimated blood loss: none.      The exam was otherwise without abnormality on direct and retroflexion       views. Descending colon polyp and smaller ascending colon polyp were       cold snared. Hemostasis clip placed on the hot snare ascending colon  polypectomy site and the larger a sending cold snare polypectomy site to       ensure hemostasis.      Scattered small-mouthed diverticula were found in the sigmoid colon. Impression:               - Two 2 to 5 mm polyps in the descending colon and                            in the ascending colon, removed with a cold snare.                            Resected and retrieved.                           - One 9 mm polyp in the ascending colon, removed                             with a hot snare. Resected and retrieved.                           - The examination was otherwise normal on direct                            and retroflexion views. Status post hemostasis clip                            placement x2                           - Diverticulosis in the sigmoid colon. Moderate Sedation:      Moderate (conscious) sedation was administered by the endoscopy nurse       and supervised by the endoscopist. The following parameters were       monitored: oxygen saturation, heart rate, blood pressure, respiratory       rate, EKG, adequacy of pulmonary ventilation, and response to care.       Total physician intraservice time was 30 minutes. Recommendation:           - Patient has a contact number available for                            emergencies. The signs and symptoms of potential                            delayed complications were discussed with the                            patient. Return to normal activities tomorrow.                            Written discharge instructions were provided to the                            patient.                           -  Resume previous diet.                           - Continue present medications. Continue Plavix                            uninterrupted. No MRI in the future until clips gone                           - Repeat colonoscopy date to be determined after                            pending pathology results are reviewed for                            surveillance.                           - Return to GI office (date not yet determined). Procedure Code(s):        --- Professional ---                           [phone], Colonoscopy, flexible; with removal of                            tumor(s), polyp(s), or other lesion(s) by snare                            technique                           99153, Moderate sedation; each additional 15                            minutes intraservice time                            G0500, Moderate sedation services provided by the                            same physician or other qualified health care                            professional performing a gastrointestinal                            endoscopic service that sedation supports,                            requiring the presence of an independent trained                            observer to assist in the monitoring of the                            patient's level of consciousness  and physiological                            status; initial 15 minutes of intra-service time;                            patient age 74 years or older (additional time may                            be reported with [phone], as appropriate) Diagnosis Code(s):        --- Professional ---                           K63.5, Polyp of colon                           Z12.11, Encounter for screening for malignant                            neoplasm of colon                           K57.30, Diverticulosis of large intestine without                            perforation or abscess without bleeding CPT copyright 2019 [employer]. All rights reserved. The codes documented in this report are preliminary and upon coder review may  be revised to meet current compliance requirements. Cristopher Estimable. Bryndan Bilyk, MD Norvel Richards, MD 04/10/2021 10:02:31 AM This report has been signed electronically. Number of Addenda: 0

## 2021-04-10 NOTE — Discharge Instructions (Signed)
°  Colonoscopy Discharge Instructions  Read the instructions outlined below and refer to this sheet in the next few weeks. These discharge instructions provide you with general information on caring for yourself after you leave the hospital. Your doctor may also give you specific instructions. While your treatment has been planned according to the most current medical practices available, unavoidable complications occasionally occur. If you have any problems or questions after discharge, call Dr. Gala Romney at [phone]. ACTIVITY You may resume your regular activity, but move at a slower pace for the next 24 hours.  Take frequent rest periods for the next 24 hours.  Walking will help get rid of the air and reduce the bloated feeling in your belly (abdomen).  No driving for 24 hours (because of the medicine (anesthesia) used during the test).   Do not sign any important legal documents or operate any machinery for 24 hours (because of the anesthesia used during the test).  NUTRITION Drink plenty of fluids.  You may resume your normal diet as instructed by your doctor.  Begin with a light meal and progress to your normal diet. Heavy or fried foods are harder to digest and may make you feel sick to your stomach (nauseated).  Avoid alcoholic beverages for 24 hours or as instructed.  MEDICATIONS You may resume your normal medications unless your doctor tells you otherwise.  WHAT YOU CAN EXPECT TODAY Some feelings of bloating in the abdomen.  Passage of more gas than usual.  Spotting of blood in your stool or on the toilet paper.  IF YOU HAD POLYPS REMOVED DURING THE COLONOSCOPY: No aspirin products for 7 days or as instructed.  No alcohol for 7 days or as instructed.  Eat a soft diet for the next 24 hours.  FINDING OUT THE RESULTS OF YOUR TEST Not all test results are available during your visit. If your test results are not back during the visit, make an appointment with your caregiver to find out the  results. Do not assume everything is normal if you have not heard from your caregiver or the medical facility. It is important for you to follow up on all of your test results.  SEEK IMMEDIATE MEDICAL ATTENTION IF: You have more than a spotting of blood in your stool.  Your belly is swollen (abdominal distention).  You are nauseated or vomiting.  You have a temperature over 101.  You have abdominal pain or discomfort that is severe or gets worse throughout the day.    3 polyps removed from your colon  Continue Plavix  No future MRI until clips gone  Further recommendations to follow pending review of pathology report  At patient request, I called Jawaan at [phone] -reviewed findings and recommendations

## 2021-04-11 ENCOUNTER — Encounter: Payer: Self-pay | Admitting: Internal Medicine

## 2021-04-11 LAB — SURGICAL PATHOLOGY

## 2021-04-12 ENCOUNTER — Encounter (HOSPITAL_COMMUNITY): Payer: Self-pay | Admitting: Internal Medicine

## 2021-05-02 DIAGNOSIS — Z79899 Other long term (current) drug therapy: Secondary | ICD-10-CM | POA: Diagnosis not present

## 2021-05-02 DIAGNOSIS — Z139 Encounter for screening, unspecified: Secondary | ICD-10-CM | POA: Diagnosis not present

## 2021-05-02 DIAGNOSIS — Z0000 Encounter for general adult medical examination without abnormal findings: Secondary | ICD-10-CM | POA: Diagnosis not present

## 2021-05-02 DIAGNOSIS — R5383 Other fatigue: Secondary | ICD-10-CM | POA: Diagnosis not present

## 2021-05-02 DIAGNOSIS — E7800 Pure hypercholesterolemia, unspecified: Secondary | ICD-10-CM | POA: Diagnosis not present

## 2021-05-02 DIAGNOSIS — Z1331 Encounter for screening for depression: Secondary | ICD-10-CM | POA: Diagnosis not present

## 2021-05-02 DIAGNOSIS — Z7189 Other specified counseling: Secondary | ICD-10-CM | POA: Diagnosis not present

## 2021-05-02 DIAGNOSIS — Z125 Encounter for screening for malignant neoplasm of prostate: Secondary | ICD-10-CM | POA: Diagnosis not present

## 2021-06-11 IMAGING — CT CT ABDOMEN WO/W CM
3 of 11 series · 11 of 46 positions shown, 17 images · IV contrast (omnipaque)
Comparison: CT scan 01/15/2020

CLINICAL DATA: History of pancreatitis.

EXAM:
CT ABDOMEN WITHOUT AND WITH CONTRAST
TECHNIQUE: Multidetector CT imaging of the abdomen was performed following the
standard protocol before and following the bolus administration of
intravenous contrast.
CONTRAST:  100mL OMNIPAQUE IOHEXOL 300 MG/ML  SOLN

[Series 4: axial arterial · axial · arterial · 0.66mm/px · z∈[+1183,+1246]mm · 2 of 85 slices shown]
[im 22/85  soft-tissue]
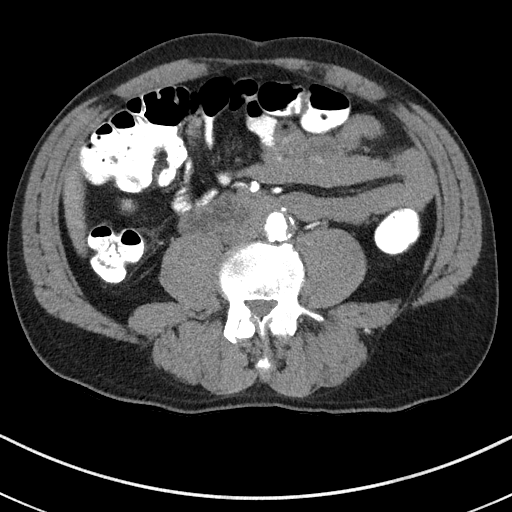
[im 43/85  soft-tissue]
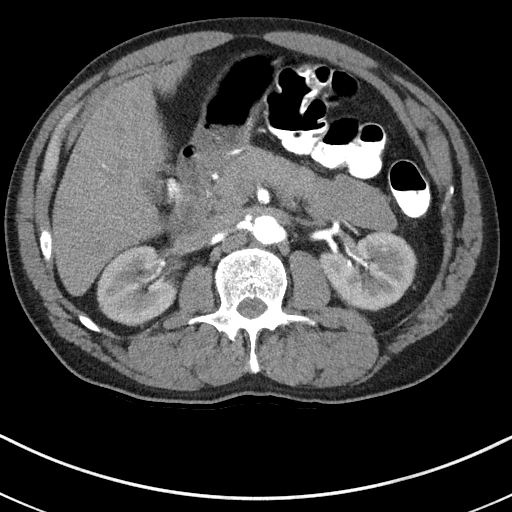

[Series 10: pancreas pv 2.0 · axial · portal-venous · 0.66mm/px · z∈[+1156,+1336]mm · 6 of 127 slices shown, 11 images]
[im 19/127  soft-tissue]
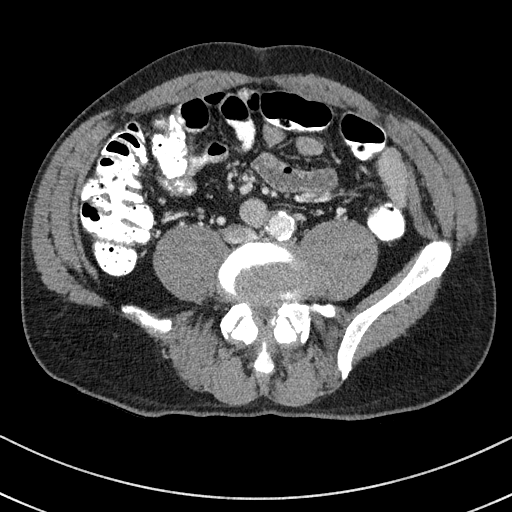
[im 19/127  bone]
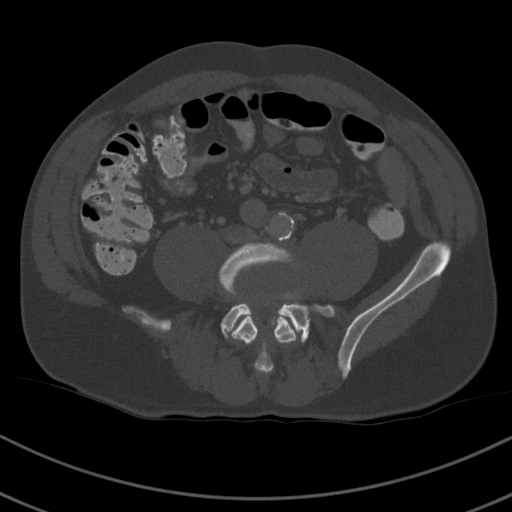
[im 37/127  soft-tissue]
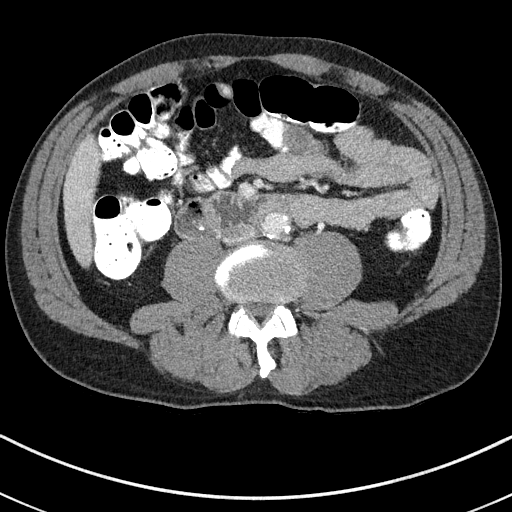
[im 55/127  soft-tissue]
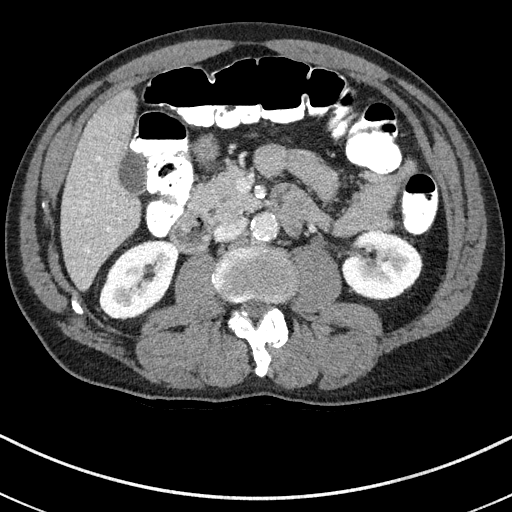
[im 55/127  lung]
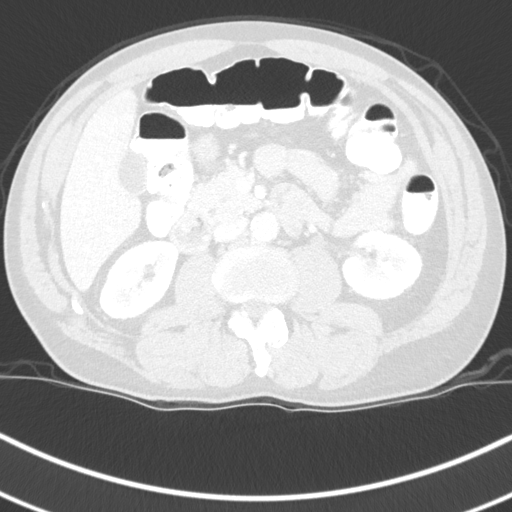
[im 73/127  soft-tissue]
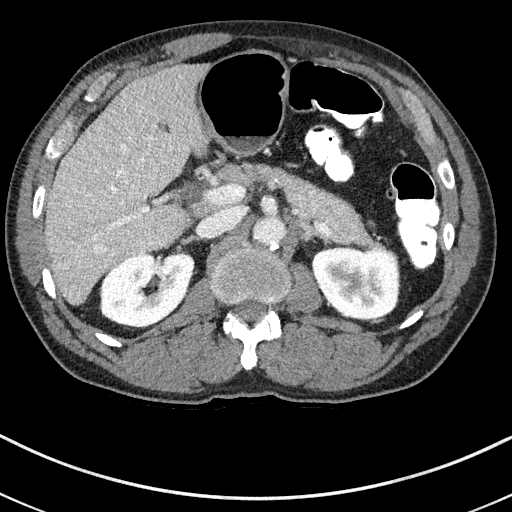
[im 73/127  lung]
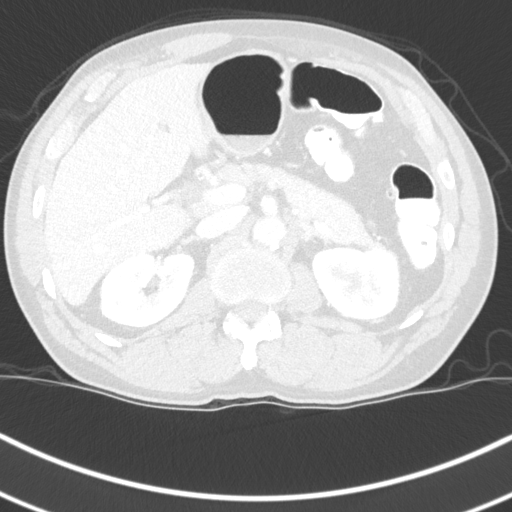
[im 91/127  soft-tissue]
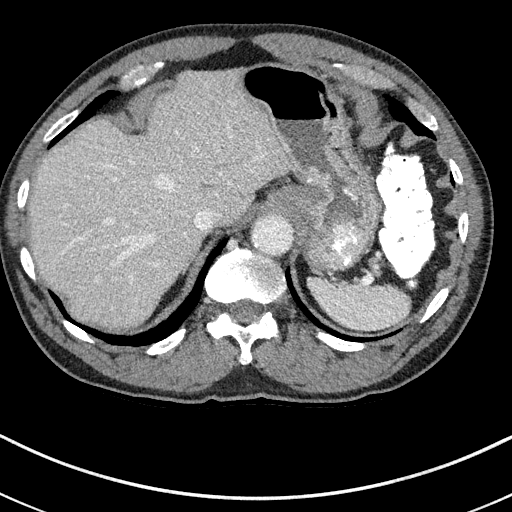
[im 91/127  lung]
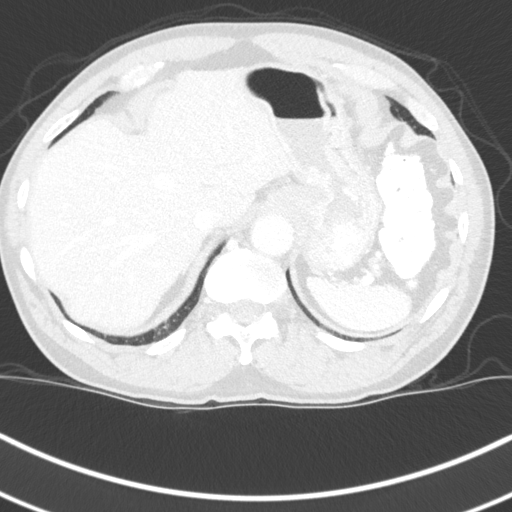
[im 109/127  soft-tissue]
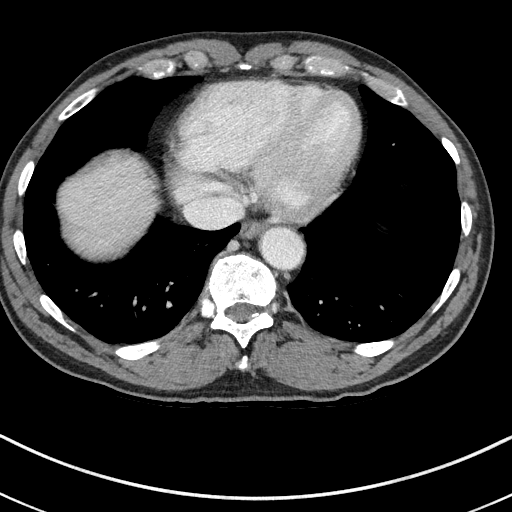
[im 109/127  lung]
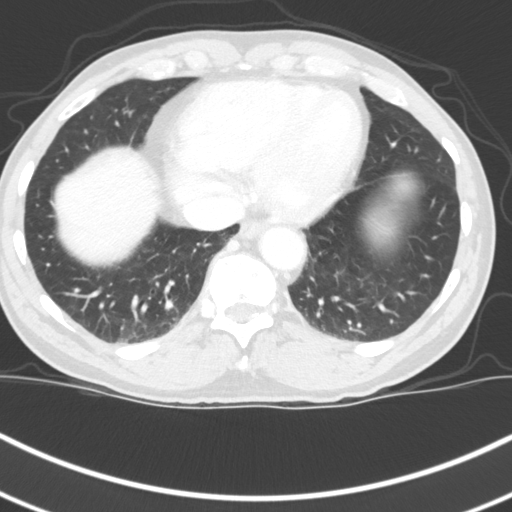

[Series 12: coronal arterial · coronal · arterial · 0.50mm/px · 3 of 100 slices shown, 4 images]
[im 25/100  soft-tissue]
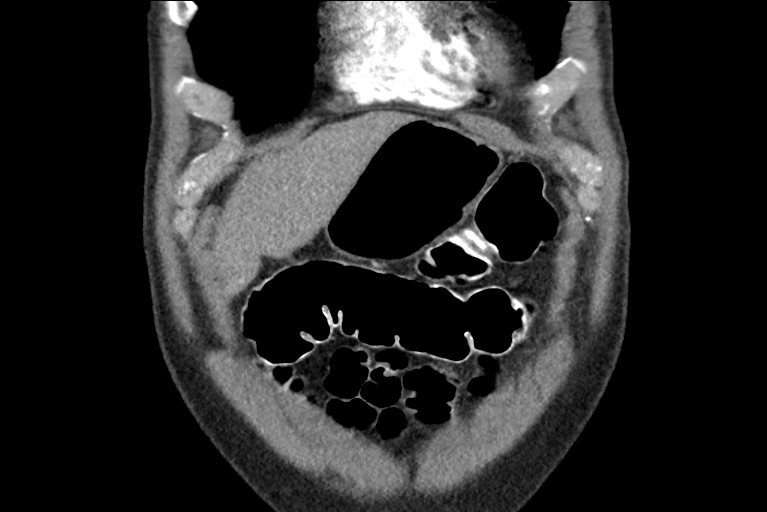
[im 50/100  soft-tissue]
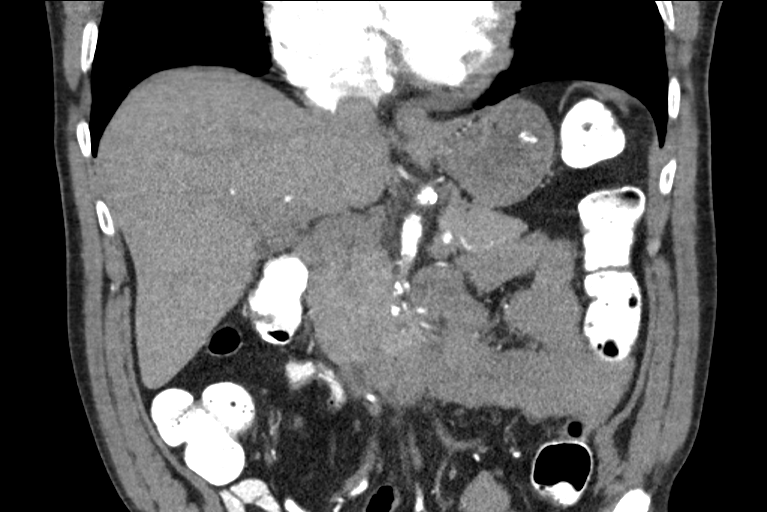
[im 50/100  bone]
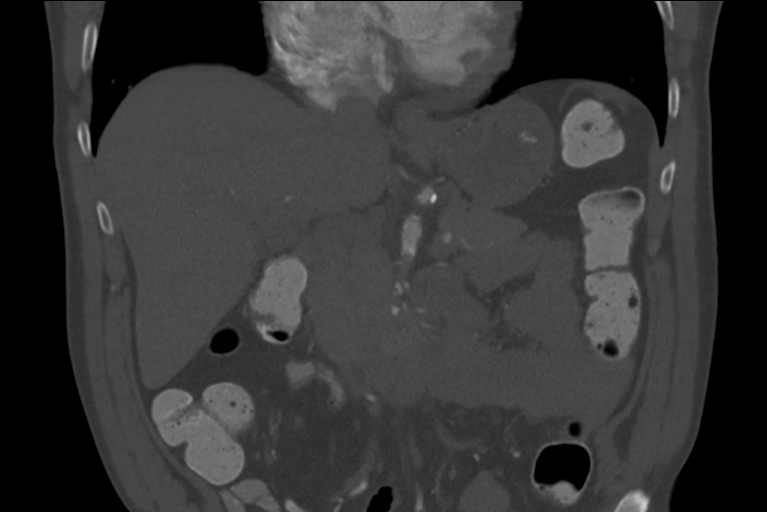
[im 75/100  soft-tissue]
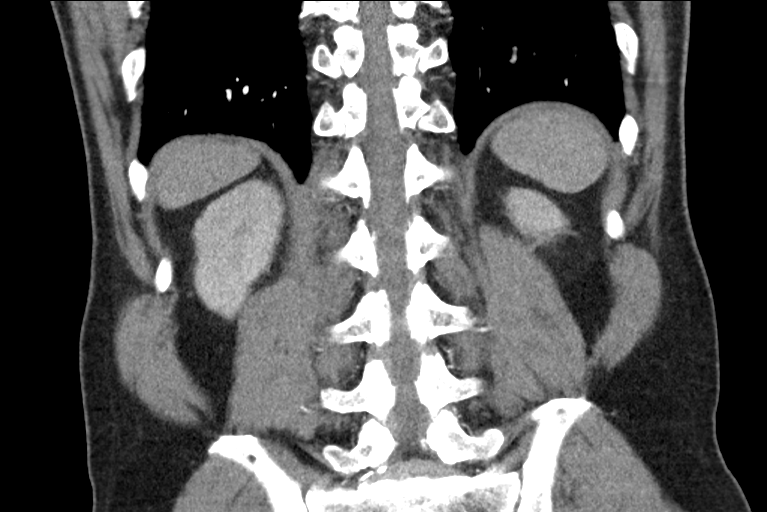

[11 of 46 positions shown; findings below may reference images not displayed]

FINDINGS: Lower chest: The lung bases are clear of acute process. No pleural
effusion or pulmonary lesions. The heart is normal in size. No
pericardial effusion. The distal esophagus and aorta are
unremarkable.

Hepatobiliary: No hepatic lesions or intrahepatic biliary
dilatation. The gallbladder is unremarkable. No common bile duct
dilatation.

Pancreas: No mass, acute inflammation or ductal dilatation. There is
a prominent/protruding soft tissue lesion into the duodenum which
may be a prominent ampulla. EGD may be helpful for further
evaluation and to exclude an ampullary mass.

Spleen: Normal size.  No focal lesions.

Adrenals/Urinary Tract: The adrenal glands and kidneys are normal.

Stomach/Bowel: The stomach, duodenum, visualized small bowel and
colon are unremarkable. No inflammatory changes or obstructive
findings.

Vascular/Lymphatic: Age advanced atherosclerotic calcifications
involving the aorta and branch vessels but no aneurysm or
dissection. No mesenteric or retroperitoneal mass or adenopathy.

Other: No ascites or abdominal wall hernia.

Musculoskeletal: No significant bony findings. There are bilateral
pars defects noted at L5 with advanced degenerative disc disease at
L5-S1.
IMPRESSION: 1. Prominent/protruding soft tissue lesion into the duodenum near
the pancreatic head. This may be a prominent/protruding ampulla. EGD
may be helpful for further evaluation and to exclude an ampullary
mass.
2. No pancreatic mass, acute inflammation or ductal dilatation.
3. Age advanced atherosclerotic calcifications involving the aorta
and branch vessels.
4. Aortic atherosclerosis.

Aortic Atherosclerosis ([id number]-[id number]).

## 2021-08-08 DIAGNOSIS — Z299 Encounter for prophylactic measures, unspecified: Secondary | ICD-10-CM | POA: Diagnosis not present

## 2021-08-08 DIAGNOSIS — Z0000 Encounter for general adult medical examination without abnormal findings: Secondary | ICD-10-CM | POA: Diagnosis not present

## 2021-08-08 DIAGNOSIS — I10 Essential (primary) hypertension: Secondary | ICD-10-CM | POA: Diagnosis not present

## 2021-08-08 DIAGNOSIS — Z6824 Body mass index (BMI) 24.0-24.9, adult: Secondary | ICD-10-CM | POA: Diagnosis not present

## 2021-08-08 DIAGNOSIS — Z87891 Personal history of nicotine dependence: Secondary | ICD-10-CM | POA: Diagnosis not present

## 2021-08-12 DIAGNOSIS — I700 Atherosclerosis of aorta: Secondary | ICD-10-CM | POA: Diagnosis not present

## 2021-11-04 DIAGNOSIS — Z299 Encounter for prophylactic measures, unspecified: Secondary | ICD-10-CM | POA: Diagnosis not present

## 2021-11-04 DIAGNOSIS — I10 Essential (primary) hypertension: Secondary | ICD-10-CM | POA: Diagnosis not present

## 2021-11-04 DIAGNOSIS — R202 Paresthesia of skin: Secondary | ICD-10-CM | POA: Diagnosis not present

## 2021-11-04 DIAGNOSIS — I25119 Atherosclerotic heart disease of native coronary artery with unspecified angina pectoris: Secondary | ICD-10-CM | POA: Diagnosis not present

## 2021-11-04 DIAGNOSIS — I700 Atherosclerosis of aorta: Secondary | ICD-10-CM | POA: Diagnosis not present

## 2021-11-04 DIAGNOSIS — Z6824 Body mass index (BMI) 24.0-24.9, adult: Secondary | ICD-10-CM | POA: Diagnosis not present

## 2022-03-05 DIAGNOSIS — Z299 Encounter for prophylactic measures, unspecified: Secondary | ICD-10-CM | POA: Diagnosis not present

## 2022-03-05 DIAGNOSIS — J069 Acute upper respiratory infection, unspecified: Secondary | ICD-10-CM | POA: Diagnosis not present

## 2022-03-05 DIAGNOSIS — R070 Pain in throat: Secondary | ICD-10-CM | POA: Diagnosis not present

## 2022-03-05 DIAGNOSIS — J029 Acute pharyngitis, unspecified: Secondary | ICD-10-CM | POA: Diagnosis not present

## 2022-05-06 DIAGNOSIS — Z299 Encounter for prophylactic measures, unspecified: Secondary | ICD-10-CM | POA: Diagnosis not present

## 2022-05-06 DIAGNOSIS — Z7189 Other specified counseling: Secondary | ICD-10-CM | POA: Diagnosis not present

## 2022-05-06 DIAGNOSIS — I25119 Atherosclerotic heart disease of native coronary artery with unspecified angina pectoris: Secondary | ICD-10-CM | POA: Diagnosis not present

## 2022-05-06 DIAGNOSIS — Z79899 Other long term (current) drug therapy: Secondary | ICD-10-CM | POA: Diagnosis not present

## 2022-05-06 DIAGNOSIS — I10 Essential (primary) hypertension: Secondary | ICD-10-CM | POA: Diagnosis not present

## 2022-05-06 DIAGNOSIS — I700 Atherosclerosis of aorta: Secondary | ICD-10-CM | POA: Diagnosis not present

## 2022-05-06 DIAGNOSIS — Z1339 Encounter for screening examination for other mental health and behavioral disorders: Secondary | ICD-10-CM | POA: Diagnosis not present

## 2022-05-06 DIAGNOSIS — Z0000 Encounter for general adult medical examination without abnormal findings: Secondary | ICD-10-CM | POA: Diagnosis not present

## 2022-05-06 DIAGNOSIS — R5383 Other fatigue: Secondary | ICD-10-CM | POA: Diagnosis not present

## 2022-05-06 DIAGNOSIS — Z125 Encounter for screening for malignant neoplasm of prostate: Secondary | ICD-10-CM | POA: Diagnosis not present

## 2022-05-06 DIAGNOSIS — E7800 Pure hypercholesterolemia, unspecified: Secondary | ICD-10-CM | POA: Diagnosis not present

## 2022-05-06 DIAGNOSIS — Z1331 Encounter for screening for depression: Secondary | ICD-10-CM | POA: Diagnosis not present

## 2022-11-11 DIAGNOSIS — I10 Essential (primary) hypertension: Secondary | ICD-10-CM | POA: Diagnosis not present

## 2022-11-11 DIAGNOSIS — Z299 Encounter for prophylactic measures, unspecified: Secondary | ICD-10-CM | POA: Diagnosis not present

## 2022-11-11 DIAGNOSIS — I25119 Atherosclerotic heart disease of native coronary artery with unspecified angina pectoris: Secondary | ICD-10-CM | POA: Diagnosis not present

## 2022-11-11 DIAGNOSIS — N1831 Chronic kidney disease, stage 3a: Secondary | ICD-10-CM | POA: Diagnosis not present

## 2022-11-11 DIAGNOSIS — F17210 Nicotine dependence, cigarettes, uncomplicated: Secondary | ICD-10-CM | POA: Diagnosis not present

## 2022-11-11 DIAGNOSIS — Z0000 Encounter for general adult medical examination without abnormal findings: Secondary | ICD-10-CM | POA: Diagnosis not present

## 2022-11-11 DIAGNOSIS — I700 Atherosclerosis of aorta: Secondary | ICD-10-CM | POA: Diagnosis not present

## 2023-01-21 DIAGNOSIS — I10 Essential (primary) hypertension: Secondary | ICD-10-CM | POA: Diagnosis not present

## 2023-01-21 DIAGNOSIS — N1831 Chronic kidney disease, stage 3a: Secondary | ICD-10-CM | POA: Diagnosis not present

## 2023-01-21 DIAGNOSIS — Z299 Encounter for prophylactic measures, unspecified: Secondary | ICD-10-CM | POA: Diagnosis not present

## 2023-01-21 DIAGNOSIS — Z2821 Immunization not carried out because of patient refusal: Secondary | ICD-10-CM | POA: Diagnosis not present

## 2023-01-21 DIAGNOSIS — I69351 Hemiplegia and hemiparesis following cerebral infarction affecting right dominant side: Secondary | ICD-10-CM | POA: Diagnosis not present

## 2023-01-31 ENCOUNTER — Emergency Department (HOSPITAL_COMMUNITY)
Admission: EM | Admit: 2023-01-31 | Discharge: 2023-01-31 | Disposition: A | Discharge status: Home / Self Care | Payer: Medicare HMO | Attending: Emergency Medicine | Admitting: Emergency Medicine

## 2023-01-31 ENCOUNTER — Encounter (HOSPITAL_COMMUNITY): Payer: Self-pay

## 2023-01-31 ENCOUNTER — Emergency Department (HOSPITAL_COMMUNITY): Payer: Medicare HMO

## 2023-01-31 ENCOUNTER — Other Ambulatory Visit: Payer: Self-pay

## 2023-01-31 DIAGNOSIS — I6782 Cerebral ischemia: Secondary | ICD-10-CM | POA: Diagnosis not present

## 2023-01-31 DIAGNOSIS — I959 Hypotension, unspecified: Secondary | ICD-10-CM | POA: Diagnosis not present

## 2023-01-31 DIAGNOSIS — R55 Syncope and collapse: Secondary | ICD-10-CM | POA: Insufficient documentation

## 2023-01-31 DIAGNOSIS — I700 Atherosclerosis of aorta: Secondary | ICD-10-CM | POA: Diagnosis not present

## 2023-01-31 DIAGNOSIS — Z7902 Long term (current) use of antithrombotics/antiplatelets: Secondary | ICD-10-CM | POA: Diagnosis not present

## 2023-01-31 DIAGNOSIS — G4489 Other headache syndrome: Secondary | ICD-10-CM | POA: Diagnosis not present

## 2023-01-31 LAB — CBG MONITORING, ED: Glucose-Capillary: 83 mg/dL (ref 70–99)

## 2023-01-31 LAB — CBC WITH DIFFERENTIAL/PLATELET
Abs Immature Granulocytes: 0.02 10*3/uL (ref 0.00–0.07)
Basophils Absolute: 0 10*3/uL (ref 0.0–0.1)
Basophils Relative: 1 %
Eosinophils Absolute: 0 10*3/uL (ref 0.0–0.5)
Eosinophils Relative: 1 %
HCT: 37.1 % — ABNORMAL LOW (ref 39.0–52.0)
Hemoglobin: 11.5 g/dL — ABNORMAL LOW (ref 13.0–17.0)
Immature Granulocytes: 1 %
Lymphocytes Relative: 16 %
Lymphs Abs: 0.7 10*3/uL (ref 0.7–4.0)
MCH: 30.9 pg (ref 26.0–34.0)
MCHC: 31 g/dL (ref 30.0–36.0)
MCV: 99.7 fL (ref 80.0–100.0)
Monocytes Absolute: 0.7 10*3/uL (ref 0.1–1.0)
Monocytes Relative: 17 %
Neutro Abs: 2.6 10*3/uL (ref 1.7–7.7)
Neutrophils Relative %: 64 %
Platelets: 139 10*3/uL — ABNORMAL LOW (ref 150–400)
RBC: 3.72 MIL/uL — ABNORMAL LOW (ref 4.22–5.81)
RDW: 18 % — ABNORMAL HIGH (ref 11.5–15.5)
WBC: 4 10*3/uL (ref 4.0–10.5)
nRBC: 0 % (ref 0.0–0.2)

## 2023-01-31 LAB — COMPREHENSIVE METABOLIC PANEL
ALT: 32 U/L (ref 0–44)
AST: 30 U/L (ref 15–41)
Albumin: 3.6 g/dL (ref 3.5–5.0)
Alkaline Phosphatase: 83 U/L (ref 38–126)
Anion gap: 11 (ref 5–15)
BUN: 13 mg/dL (ref 8–23)
CO2: 24 mmol/L (ref 22–32)
Calcium: 9.4 mg/dL (ref 8.9–10.3)
Chloride: 104 mmol/L (ref 98–111)
Creatinine, Ser: 1.52 mg/dL — ABNORMAL HIGH (ref 0.61–1.24)
GFR, Estimated: 50 mL/min — ABNORMAL LOW (ref >60)
Glucose, Bld: 99 mg/dL (ref 70–99)
Potassium: 4.1 mmol/L (ref 3.5–5.1)
Sodium: 139 mmol/L (ref 135–145)
Total Bilirubin: 0.9 mg/dL (ref 0.3–1.2)
Total Protein: 7.7 g/dL (ref 6.5–8.1)

## 2023-01-31 NOTE — ED Provider Notes (Signed)
[place] EMERGENCY DEPARTMENT AT [facility] Provider Note   CSN: [id number] Arrival date & time: 01/31/23  1243     History  Chief Complaint  Patient presents with   Near Syncope    Ethan Fields is a 67 y.o. male.  Patient reports he was at a football game today.  Patient reports he was sitting in the grass in the shade and felt like he was going to pass out.  Patient reports feeling like he almost passed out.  Patient states he did not completely lose consciousness.  Patient states he was sitting he did not fall.  Patient denies any injuries.  Patient reports he has not ate or drank today.  Patient reports he was just not hungry this morning.  The history is provided by the patient. No language interpreter was used.  Near Syncope       Home Medications Prior to Admission medications   Medication Sig Start Date End Date Taking? Authorizing Provider  amLODipine (NORVASC) 5 MG tablet Take 5 mg by mouth daily. 09/27/16  Yes [provider]  atorvastatin (LIPITOR) 20 MG tablet Take 20 mg by mouth daily. 10/04/16  Yes [provider]  carvedilol (COREG) 6.25 MG tablet Take 6.25 mg by mouth 2 (two) times daily with a meal.   Yes [provider]  clopidogrel (PLAVIX) 75 MG tablet Take 75 mg by mouth daily.   Yes [provider]  hydrochlorothiazide (HYDRODIURIL) 25 MG tablet Take 25 mg by mouth daily. 03/14/20  Yes [provider]      Allergies    Lisinopril    Review of Systems   Review of Systems  Constitutional:  Positive for appetite change.  Cardiovascular:  Positive for near-syncope.  All other systems reviewed and are negative.   Physical Exam Updated Vital Signs BP 108/71   Pulse 64   Temp 98.2 F (36.8 C) (Oral)   Resp 16   Ht 5\' 11"  (1.803 m)   Wt 72.6 kg   SpO2 91%   BMI 22.32 kg/m  Physical Exam Vitals and nursing note reviewed.  Constitutional:      Appearance: He is well-developed.  HENT:      Head: Normocephalic.     Mouth/Throat:     Mouth: Mucous membranes are moist.  Eyes:     Pupils: Pupils are equal, round, and reactive to light.  Cardiovascular:     Rate and Rhythm: Normal rate.  Pulmonary:     Effort: Pulmonary effort is normal.  Abdominal:     General: There is no distension.  Musculoskeletal:        General: Normal range of motion.     Cervical back: Normal range of motion.  Skin:    General: Skin is warm.  Neurological:     Mental Status: He is alert and oriented to person, place, and time.     ED Results / Procedures / Treatments   Labs (all labs ordered are listed, but only abnormal results are displayed) Labs Reviewed  CBC WITH DIFFERENTIAL/PLATELET  COMPREHENSIVE METABOLIC PANEL  CBG MONITORING, ED    EKG EKG Interpretation Date/Time:  Saturday January 31 2023 12:52:28 EDT Ventricular Rate:  67 PR Interval:  146 QRS Duration:  109 QT Interval:  437 QTC Calculation: 462 R Axis:   95  Text Interpretation: Sinus rhythm Atrial premature complex Probable left atrial enlargement Probable right ventricular hypertrophy No significant change since last tracing Confirmed by Linwood Dibbles [phone]) on  01/31/2023 12:54:50 PM  Radiology No results found.  Procedures Procedures    Medications Ordered in ED Medications - No data to display  ED Course/ Medical Decision Making/ A&P                                 Medical Decision Making Patient reports near syncopal episode while watching a football game.  Patient has not been eating or drinking.  She reports he was sitting in the grass at the game and felt like he was going to blackout  Amount and/or Complexity of Data Reviewed Labs: ordered. Decision-making details documented in ED Course. Radiology: ordered.           Final Clinical Impression(s) / ED Diagnoses Final diagnoses:  Near syncope    Rx / DC Orders ED Discharge Orders     None     An After Visit Summary was  printed and given to the patient.  An After Visit Summary was printed and given to the patient.    Elson Areas, [place] 01/31/23 1613    Linwood Dibbles, MD 02/02/23 1002

## 2023-01-31 NOTE — ED Notes (Signed)
Spoke with Whitney in lab. RN to recollect labs

## 2023-01-31 NOTE — ED Triage Notes (Addendum)
Patient brought in by [facility] for near syncopal event at a football game. Patient was sitting in the shade when he felt like he was going to pass out. Poor PO intake x few days. Absent radials on arrival of EMS, hypotensive on scene. 20 L forearm, given 300 NS. Initial BP 80/palp. HR 70. O2 98 on room air.

## 2023-01-31 NOTE — ED Notes (Signed)
Patient doesn't have pacemaker

## 2023-01-31 NOTE — Discharge Instructions (Signed)
Return if any problems.  See your Physician for recheck

## 2023-05-08 DIAGNOSIS — R5383 Other fatigue: Secondary | ICD-10-CM | POA: Diagnosis not present

## 2023-05-08 DIAGNOSIS — I10 Essential (primary) hypertension: Secondary | ICD-10-CM | POA: Diagnosis not present

## 2023-05-08 DIAGNOSIS — Z7189 Other specified counseling: Secondary | ICD-10-CM | POA: Diagnosis not present

## 2023-05-08 DIAGNOSIS — E7800 Pure hypercholesterolemia, unspecified: Secondary | ICD-10-CM | POA: Diagnosis not present

## 2023-05-08 DIAGNOSIS — Z299 Encounter for prophylactic measures, unspecified: Secondary | ICD-10-CM | POA: Diagnosis not present

## 2023-05-08 DIAGNOSIS — Z79899 Other long term (current) drug therapy: Secondary | ICD-10-CM | POA: Diagnosis not present

## 2023-05-08 DIAGNOSIS — Z0000 Encounter for general adult medical examination without abnormal findings: Secondary | ICD-10-CM | POA: Diagnosis not present

## 2023-10-05 DIAGNOSIS — I25119 Atherosclerotic heart disease of native coronary artery with unspecified angina pectoris: Secondary | ICD-10-CM | POA: Diagnosis not present

## 2023-10-05 DIAGNOSIS — Z0000 Encounter for general adult medical examination without abnormal findings: Secondary | ICD-10-CM | POA: Diagnosis not present

## 2023-10-05 DIAGNOSIS — Z6823 Body mass index (BMI) 23.0-23.9, adult: Secondary | ICD-10-CM | POA: Diagnosis not present

## 2023-10-05 DIAGNOSIS — Z299 Encounter for prophylactic measures, unspecified: Secondary | ICD-10-CM | POA: Diagnosis not present

## 2023-10-05 DIAGNOSIS — I10 Essential (primary) hypertension: Secondary | ICD-10-CM | POA: Diagnosis not present
